# Patient Record
Sex: Male | Born: 1935 | Race: White | Hispanic: No | Marital: Single
Health system: Southern US, Community
[De-identification: ages and names within clinical notes are randomized; demographics above are authoritative.]

---

## 2020-01-07 ENCOUNTER — Other Ambulatory Visit: Payer: Self-pay

## 2020-01-07 ENCOUNTER — Inpatient Hospital Stay (HOSPITAL_COMMUNITY)
Admission: EM | Admit: 2020-01-07 | Discharge: 2020-01-13 | DRG: 062 | Disposition: A | Discharge status: Skilled Nursing Facility | Payer: Medicare Other | Attending: Neurology | Admitting: Neurology

## 2020-01-07 ENCOUNTER — Emergency Department (HOSPITAL_COMMUNITY): Payer: Medicare Other

## 2020-01-07 ENCOUNTER — Encounter (HOSPITAL_COMMUNITY): Payer: Self-pay | Admitting: Radiology

## 2020-01-07 DIAGNOSIS — R32 Unspecified urinary incontinence: Secondary | ICD-10-CM | POA: Diagnosis present

## 2020-01-07 DIAGNOSIS — Z8546 Personal history of malignant neoplasm of prostate: Secondary | ICD-10-CM

## 2020-01-07 DIAGNOSIS — Z20822 Contact with and (suspected) exposure to covid-19: Secondary | ICD-10-CM | POA: Diagnosis present

## 2020-01-07 DIAGNOSIS — Z66 Do not resuscitate: Secondary | ICD-10-CM | POA: Diagnosis present

## 2020-01-07 DIAGNOSIS — R414 Neurologic neglect syndrome: Secondary | ICD-10-CM | POA: Diagnosis present

## 2020-01-07 DIAGNOSIS — R4781 Slurred speech: Secondary | ICD-10-CM | POA: Diagnosis present

## 2020-01-07 DIAGNOSIS — I6521 Occlusion and stenosis of right carotid artery: Secondary | ICD-10-CM | POA: Diagnosis present

## 2020-01-07 DIAGNOSIS — E079 Disorder of thyroid, unspecified: Secondary | ICD-10-CM | POA: Diagnosis present

## 2020-01-07 DIAGNOSIS — E876 Hypokalemia: Secondary | ICD-10-CM | POA: Diagnosis present

## 2020-01-07 DIAGNOSIS — Y900 Blood alcohol level of less than 20 mg/100 ml: Secondary | ICD-10-CM | POA: Diagnosis present

## 2020-01-07 DIAGNOSIS — I63511 Cerebral infarction due to unspecified occlusion or stenosis of right middle cerebral artery: Secondary | ICD-10-CM | POA: Diagnosis not present

## 2020-01-07 DIAGNOSIS — E1122 Type 2 diabetes mellitus with diabetic chronic kidney disease: Secondary | ICD-10-CM | POA: Diagnosis present

## 2020-01-07 DIAGNOSIS — R05 Cough: Secondary | ICD-10-CM

## 2020-01-07 DIAGNOSIS — E1151 Type 2 diabetes mellitus with diabetic peripheral angiopathy without gangrene: Secondary | ICD-10-CM | POA: Diagnosis present

## 2020-01-07 DIAGNOSIS — R29715 NIHSS score 15: Secondary | ICD-10-CM | POA: Diagnosis present

## 2020-01-07 DIAGNOSIS — R29810 Facial weakness: Secondary | ICD-10-CM | POA: Diagnosis present

## 2020-01-07 DIAGNOSIS — Z6829 Body mass index (BMI) 29.0-29.9, adult: Secondary | ICD-10-CM

## 2020-01-07 DIAGNOSIS — I639 Cerebral infarction, unspecified: Secondary | ICD-10-CM | POA: Diagnosis not present

## 2020-01-07 DIAGNOSIS — F0390 Unspecified dementia without behavioral disturbance: Secondary | ICD-10-CM | POA: Diagnosis present

## 2020-01-07 DIAGNOSIS — R451 Restlessness and agitation: Secondary | ICD-10-CM | POA: Diagnosis not present

## 2020-01-07 DIAGNOSIS — I63529 Cerebral infarction due to unspecified occlusion or stenosis of unspecified anterior cerebral artery: Secondary | ICD-10-CM | POA: Diagnosis present

## 2020-01-07 DIAGNOSIS — F329 Major depressive disorder, single episode, unspecified: Secondary | ICD-10-CM | POA: Diagnosis present

## 2020-01-07 DIAGNOSIS — Z888 Allergy status to other drugs, medicaments and biological substances status: Secondary | ICD-10-CM

## 2020-01-07 DIAGNOSIS — G8194 Hemiplegia, unspecified affecting left nondominant side: Secondary | ICD-10-CM | POA: Diagnosis present

## 2020-01-07 DIAGNOSIS — I129 Hypertensive chronic kidney disease with stage 1 through stage 4 chronic kidney disease, or unspecified chronic kidney disease: Secondary | ICD-10-CM | POA: Diagnosis present

## 2020-01-07 DIAGNOSIS — E669 Obesity, unspecified: Secondary | ICD-10-CM | POA: Diagnosis present

## 2020-01-07 DIAGNOSIS — R059 Cough, unspecified: Secondary | ICD-10-CM

## 2020-01-07 DIAGNOSIS — N1831 Chronic kidney disease, stage 3a: Secondary | ICD-10-CM | POA: Diagnosis present

## 2020-01-07 DIAGNOSIS — K5900 Constipation, unspecified: Secondary | ICD-10-CM | POA: Diagnosis present

## 2020-01-07 DIAGNOSIS — Z79899 Other long term (current) drug therapy: Secondary | ICD-10-CM

## 2020-01-07 DIAGNOSIS — Z9079 Acquired absence of other genital organ(s): Secondary | ICD-10-CM

## 2020-01-07 DIAGNOSIS — E785 Hyperlipidemia, unspecified: Secondary | ICD-10-CM | POA: Diagnosis present

## 2020-01-07 DIAGNOSIS — R1310 Dysphagia, unspecified: Secondary | ICD-10-CM | POA: Diagnosis present

## 2020-01-07 LAB — CBC
HCT: 46.2 % (ref 39.0–52.0)
Hemoglobin: 14.8 g/dL (ref 13.0–17.0)
MCH: 28.4 pg (ref 26.0–34.0)
MCHC: 32 g/dL (ref 30.0–36.0)
MCV: 88.7 fL (ref 80.0–100.0)
Platelets: 201 10*3/uL (ref 150–400)
RBC: 5.21 MIL/uL (ref 4.22–5.81)
RDW: 14.2 % (ref 11.5–15.5)
WBC: 7.7 10*3/uL (ref 4.0–10.5)
nRBC: 0 % (ref 0.0–0.2)

## 2020-01-07 LAB — I-STAT CHEM 8, ED
BUN: 21 mg/dL (ref 8–23)
Calcium, Ion: 1.19 mmol/L (ref 1.15–1.40)
Chloride: 100 mmol/L (ref 98–111)
Creatinine, Ser: 1.2 mg/dL (ref 0.61–1.24)
Glucose, Bld: 185 mg/dL — ABNORMAL HIGH (ref 70–99)
HCT: 44 % (ref 39.0–52.0)
Hemoglobin: 15 g/dL (ref 13.0–17.0)
Potassium: 3.5 mmol/L (ref 3.5–5.1)
Sodium: 138 mmol/L (ref 135–145)
TCO2: 30 mmol/L (ref 22–32)

## 2020-01-07 LAB — DIFFERENTIAL
Abs Immature Granulocytes: 0.03 10*3/uL (ref 0.00–0.07)
Basophils Absolute: 0 10*3/uL (ref 0.0–0.1)
Basophils Relative: 1 %
Eosinophils Absolute: 0.2 10*3/uL (ref 0.0–0.5)
Eosinophils Relative: 3 %
Immature Granulocytes: 0 %
Lymphocytes Relative: 23 %
Lymphs Abs: 1.8 10*3/uL (ref 0.7–4.0)
Monocytes Absolute: 0.6 10*3/uL (ref 0.1–1.0)
Monocytes Relative: 7 %
Neutro Abs: 5.1 10*3/uL (ref 1.7–7.7)
Neutrophils Relative %: 66 %

## 2020-01-07 LAB — CBG MONITORING, ED: Glucose-Capillary: 180 mg/dL — ABNORMAL HIGH (ref 70–99)

## 2020-01-07 MED ORDER — ALTEPLASE (STROKE) FULL DOSE INFUSION
90.0000 mg | Freq: Once | INTRAVENOUS | Status: AC
  Administered 2020-01-07: 90 mg via INTRAVENOUS
  Filled 2020-01-07: qty 100

## 2020-01-07 MED ORDER — IOHEXOL 350 MG/ML SOLN
75.0000 mL | Freq: Once | INTRAVENOUS | Status: AC | PRN
  Administered 2020-01-07: 75 mL via INTRAVENOUS

## 2020-01-07 MED ORDER — SODIUM CHLORIDE 0.9 % IV SOLN
50.0000 mL | Freq: Once | INTRAVENOUS | Status: AC
  Administered 2020-01-08: 50 mL via INTRAVENOUS

## 2020-01-07 NOTE — ED Triage Notes (Signed)
Patient arrived with EMS from [facility] , staff reported LSN 2200 , at 2245 noticed right side gaze with slurred speech , right facial droop and left arm weakness , evaluated by EDP and neurologist at arrival , transported to CT scan with RN and neurologist .

## 2020-01-07 NOTE — Progress Notes (Signed)
Pharmacist Code Stroke Response  Notified to mix tPA at 2345 by Dr. Otelia Limes Delivered tPA to RN at 2348  tPA dose = 9mg  bolus over 1 minute followed by 81mg  for a total dose of 90mg  over 1 hour   01/07/20 11:51 PM

## 2020-01-07 NOTE — ED Notes (Signed)
TPA  IV infusion started at 2350 , respirations unlabored , iv sites intact .

## 2020-01-08 ENCOUNTER — Emergency Department (HOSPITAL_COMMUNITY): Payer: Medicare Other

## 2020-01-08 ENCOUNTER — Inpatient Hospital Stay (HOSPITAL_COMMUNITY): Payer: Medicare Other

## 2020-01-08 DIAGNOSIS — N1831 Chronic kidney disease, stage 3a: Secondary | ICD-10-CM | POA: Diagnosis present

## 2020-01-08 DIAGNOSIS — Z66 Do not resuscitate: Secondary | ICD-10-CM | POA: Diagnosis present

## 2020-01-08 DIAGNOSIS — I63529 Cerebral infarction due to unspecified occlusion or stenosis of unspecified anterior cerebral artery: Secondary | ICD-10-CM | POA: Diagnosis present

## 2020-01-08 DIAGNOSIS — I639 Cerebral infarction, unspecified: Secondary | ICD-10-CM | POA: Diagnosis present

## 2020-01-08 DIAGNOSIS — Z888 Allergy status to other drugs, medicaments and biological substances status: Secondary | ICD-10-CM | POA: Diagnosis not present

## 2020-01-08 DIAGNOSIS — E1151 Type 2 diabetes mellitus with diabetic peripheral angiopathy without gangrene: Secondary | ICD-10-CM | POA: Diagnosis present

## 2020-01-08 DIAGNOSIS — R32 Unspecified urinary incontinence: Secondary | ICD-10-CM | POA: Diagnosis present

## 2020-01-08 DIAGNOSIS — I6521 Occlusion and stenosis of right carotid artery: Secondary | ICD-10-CM | POA: Diagnosis present

## 2020-01-08 DIAGNOSIS — Y900 Blood alcohol level of less than 20 mg/100 ml: Secondary | ICD-10-CM | POA: Diagnosis present

## 2020-01-08 DIAGNOSIS — K5900 Constipation, unspecified: Secondary | ICD-10-CM | POA: Diagnosis present

## 2020-01-08 DIAGNOSIS — I6389 Other cerebral infarction: Secondary | ICD-10-CM

## 2020-01-08 DIAGNOSIS — Z79899 Other long term (current) drug therapy: Secondary | ICD-10-CM | POA: Diagnosis not present

## 2020-01-08 DIAGNOSIS — E1122 Type 2 diabetes mellitus with diabetic chronic kidney disease: Secondary | ICD-10-CM | POA: Diagnosis present

## 2020-01-08 DIAGNOSIS — I10 Essential (primary) hypertension: Secondary | ICD-10-CM | POA: Diagnosis not present

## 2020-01-08 DIAGNOSIS — I129 Hypertensive chronic kidney disease with stage 1 through stage 4 chronic kidney disease, or unspecified chronic kidney disease: Secondary | ICD-10-CM | POA: Diagnosis present

## 2020-01-08 DIAGNOSIS — R1310 Dysphagia, unspecified: Secondary | ICD-10-CM | POA: Diagnosis present

## 2020-01-08 DIAGNOSIS — R29810 Facial weakness: Secondary | ICD-10-CM | POA: Diagnosis present

## 2020-01-08 DIAGNOSIS — G8194 Hemiplegia, unspecified affecting left nondominant side: Secondary | ICD-10-CM | POA: Diagnosis present

## 2020-01-08 DIAGNOSIS — F0390 Unspecified dementia without behavioral disturbance: Secondary | ICD-10-CM | POA: Diagnosis present

## 2020-01-08 DIAGNOSIS — R414 Neurologic neglect syndrome: Secondary | ICD-10-CM | POA: Diagnosis present

## 2020-01-08 DIAGNOSIS — E876 Hypokalemia: Secondary | ICD-10-CM | POA: Diagnosis present

## 2020-01-08 DIAGNOSIS — R29715 NIHSS score 15: Secondary | ICD-10-CM | POA: Diagnosis present

## 2020-01-08 DIAGNOSIS — Z20822 Contact with and (suspected) exposure to covid-19: Secondary | ICD-10-CM | POA: Diagnosis present

## 2020-01-08 DIAGNOSIS — E1159 Type 2 diabetes mellitus with other circulatory complications: Secondary | ICD-10-CM | POA: Diagnosis not present

## 2020-01-08 DIAGNOSIS — E785 Hyperlipidemia, unspecified: Secondary | ICD-10-CM | POA: Diagnosis present

## 2020-01-08 DIAGNOSIS — R4781 Slurred speech: Secondary | ICD-10-CM | POA: Diagnosis present

## 2020-01-08 DIAGNOSIS — Z8546 Personal history of malignant neoplasm of prostate: Secondary | ICD-10-CM | POA: Diagnosis not present

## 2020-01-08 DIAGNOSIS — I6340 Cerebral infarction due to embolism of unspecified cerebral artery: Secondary | ICD-10-CM | POA: Diagnosis not present

## 2020-01-08 DIAGNOSIS — I63511 Cerebral infarction due to unspecified occlusion or stenosis of right middle cerebral artery: Secondary | ICD-10-CM | POA: Diagnosis present

## 2020-01-08 DIAGNOSIS — Z9079 Acquired absence of other genital organ(s): Secondary | ICD-10-CM | POA: Diagnosis not present

## 2020-01-08 LAB — GLUCOSE, CAPILLARY
Glucose-Capillary: 120 mg/dL — ABNORMAL HIGH (ref 70–99)
Glucose-Capillary: 133 mg/dL — ABNORMAL HIGH (ref 70–99)
Glucose-Capillary: 167 mg/dL — ABNORMAL HIGH (ref 70–99)

## 2020-01-08 LAB — LIPID PANEL
Cholesterol: 190 mg/dL (ref 0–200)
HDL: 60 mg/dL (ref >40)
LDL Cholesterol: 108 mg/dL — ABNORMAL HIGH (ref 0–99)
Total CHOL/HDL Ratio: 3.2 RATIO
Triglycerides: 111 mg/dL (ref <150)
VLDL: 22 mg/dL (ref 0–40)

## 2020-01-08 LAB — ECHOCARDIOGRAM COMPLETE
Height: 72 in
Weight: 3432.12 oz

## 2020-01-08 LAB — COMPREHENSIVE METABOLIC PANEL
ALT: 21 U/L (ref 0–44)
AST: 18 U/L (ref 15–41)
Albumin: 3.3 g/dL — ABNORMAL LOW (ref 3.5–5.0)
Alkaline Phosphatase: 66 U/L (ref 38–126)
Anion gap: 8 (ref 5–15)
BUN: 19 mg/dL (ref 8–23)
CO2: 27 mmol/L (ref 22–32)
Calcium: 9 mg/dL (ref 8.9–10.3)
Chloride: 103 mmol/L (ref 98–111)
Creatinine, Ser: 1.17 mg/dL (ref 0.61–1.24)
GFR calc Af Amer: >60 mL/min (ref >60)
GFR calc non Af Amer: 57 mL/min — ABNORMAL LOW (ref >60)
Glucose, Bld: 188 mg/dL — ABNORMAL HIGH (ref 70–99)
Potassium: 3.8 mmol/L (ref 3.5–5.1)
Sodium: 138 mmol/L (ref 135–145)
Total Bilirubin: 0.7 mg/dL (ref 0.3–1.2)
Total Protein: 6.3 g/dL — ABNORMAL LOW (ref 6.5–8.1)

## 2020-01-08 LAB — PROTIME-INR
INR: 1.1 (ref 0.8–1.2)
Prothrombin Time: 13.7 seconds (ref 11.4–15.2)

## 2020-01-08 LAB — MRSA PCR SCREENING: MRSA by PCR: NEGATIVE

## 2020-01-08 LAB — RESPIRATORY PANEL BY RT PCR (FLU A&B, COVID)
Influenza A by PCR: NEGATIVE
Influenza B by PCR: NEGATIVE
SARS Coronavirus 2 by RT PCR: NEGATIVE

## 2020-01-08 LAB — APTT: aPTT: 30 seconds (ref 24–36)

## 2020-01-08 LAB — ETHANOL: Alcohol, Ethyl (B): <10 mg/dL (ref <10)

## 2020-01-08 MED ORDER — CHLORHEXIDINE GLUCONATE 0.12 % MT SOLN
15.0000 mL | Freq: Two times a day (BID) | OROMUCOSAL | Status: DC
  Administered 2020-01-08 – 2020-01-13 (×12): 15 mL via OROMUCOSAL
  Filled 2020-01-08 (×10): qty 15

## 2020-01-08 MED ORDER — PRAVASTATIN SODIUM 40 MG PO TABS
40.0000 mg | ORAL_TABLET | Freq: Every day | ORAL | Status: DC
  Administered 2020-01-08 – 2020-01-12 (×5): 40 mg via ORAL
  Filled 2020-01-08 (×6): qty 1

## 2020-01-08 MED ORDER — ACETAMINOPHEN 325 MG PO TABS
650.0000 mg | ORAL_TABLET | ORAL | Status: DC | PRN
  Administered 2020-01-11: 650 mg via ORAL
  Filled 2020-01-08: qty 2

## 2020-01-08 MED ORDER — LISINOPRIL 20 MG PO TABS
20.0000 mg | ORAL_TABLET | Freq: Every day | ORAL | Status: DC
  Administered 2020-01-08 – 2020-01-13 (×6): 20 mg via ORAL
  Filled 2020-01-08 (×7): qty 1

## 2020-01-08 MED ORDER — ACETAMINOPHEN 650 MG RE SUPP: 650.0000 mg | RECTAL | Status: DC | PRN

## 2020-01-08 MED ORDER — PANTOPRAZOLE SODIUM 40 MG IV SOLR
40.0000 mg | Freq: Every day | INTRAVENOUS | Status: DC
  Administered 2020-01-08 – 2020-01-11 (×5): 40 mg via INTRAVENOUS
  Filled 2020-01-08 (×5): qty 40

## 2020-01-08 MED ORDER — HYDRALAZINE HCL 20 MG/ML IJ SOLN
INTRAMUSCULAR | Status: AC
  Filled 2020-01-08: qty 1

## 2020-01-08 MED ORDER — HYDRALAZINE HCL 20 MG/ML IJ SOLN
10.0000 mg | Freq: Once | INTRAMUSCULAR | Status: AC
  Administered 2020-01-08: 10 mg via INTRAVENOUS

## 2020-01-08 MED ORDER — CLEVIDIPINE BUTYRATE 0.5 MG/ML IV EMUL
0.0000 mg/h | INTRAVENOUS | Status: DC
  Administered 2020-01-08: 1 mg/h via INTRAVENOUS
  Filled 2020-01-08 (×2): qty 50

## 2020-01-08 MED ORDER — CHLORHEXIDINE GLUCONATE CLOTH 2 % EX PADS
6.0000 | MEDICATED_PAD | Freq: Every day | CUTANEOUS | Status: DC
  Administered 2020-01-08 – 2020-01-09 (×2): 6 via TOPICAL

## 2020-01-08 MED ORDER — POLYETHYLENE GLYCOL 3350 17 G PO PACK
17.0000 g | PACK | Freq: Every day | ORAL | Status: DC | PRN
  Administered 2020-01-13: 17 g via ORAL
  Filled 2020-01-08: qty 1

## 2020-01-08 MED ORDER — HYDRALAZINE HCL 20 MG/ML IJ SOLN
20.0000 mg | Freq: Four times a day (QID) | INTRAMUSCULAR | Status: DC | PRN
  Administered 2020-01-08 – 2020-01-09 (×2): 20 mg via INTRAVENOUS
  Filled 2020-01-08 (×2): qty 1

## 2020-01-08 MED ORDER — ACETAMINOPHEN 160 MG/5ML PO SOLN: 650.0000 mg | ORAL | Status: DC | PRN

## 2020-01-08 MED ORDER — STROKE: EARLY STAGES OF RECOVERY BOOK
Freq: Once | Status: AC
  Filled 2020-01-08: qty 1

## 2020-01-08 MED ORDER — DONEPEZIL HCL 10 MG PO TABS
10.0000 mg | ORAL_TABLET | Freq: Every day | ORAL | Status: DC
  Administered 2020-01-09 – 2020-01-12 (×5): 10 mg via ORAL
  Filled 2020-01-08 (×5): qty 1

## 2020-01-08 MED ORDER — VENLAFAXINE HCL ER 75 MG PO CP24
150.0000 mg | ORAL_CAPSULE | Freq: Every day | ORAL | Status: DC
  Administered 2020-01-08 – 2020-01-13 (×6): 150 mg via ORAL
  Filled 2020-01-08 (×2): qty 2
  Filled 2020-01-08 (×2): qty 1
  Filled 2020-01-08 (×4): qty 2
  Filled 2020-01-08: qty 1

## 2020-01-08 MED ORDER — SENNOSIDES-DOCUSATE SODIUM 8.6-50 MG PO TABS
1.0000 | ORAL_TABLET | Freq: Every evening | ORAL | Status: DC | PRN
  Administered 2020-01-13: 1 via ORAL
  Filled 2020-01-08: qty 1

## 2020-01-08 MED ORDER — HYDROCHLOROTHIAZIDE 12.5 MG PO CAPS
12.5000 mg | ORAL_CAPSULE | Freq: Every day | ORAL | Status: DC
  Administered 2020-01-08 – 2020-01-12 (×5): 12.5 mg via ORAL
  Filled 2020-01-08 (×6): qty 1

## 2020-01-08 MED ORDER — PRAVASTATIN SODIUM 10 MG PO TABS: 20.0000 mg | ORAL_TABLET | Freq: Every day | ORAL | Status: DC

## 2020-01-08 MED ORDER — BUSPIRONE HCL 10 MG PO TABS
10.0000 mg | ORAL_TABLET | Freq: Two times a day (BID) | ORAL | Status: DC
  Administered 2020-01-09 – 2020-01-13 (×10): 10 mg via ORAL
  Filled 2020-01-08 (×11): qty 1

## 2020-01-08 MED ORDER — ORAL CARE MOUTH RINSE
15.0000 mL | Freq: Two times a day (BID) | OROMUCOSAL | Status: DC
  Administered 2020-01-08 – 2020-01-13 (×10): 15 mL via OROMUCOSAL

## 2020-01-08 MED ORDER — SODIUM CHLORIDE 0.9 % IV SOLN: INTRAVENOUS | Status: AC

## 2020-01-08 MED ORDER — ASPIRIN 81 MG PO CHEW
CHEWABLE_TABLET | ORAL | Status: AC
  Filled 2020-01-08: qty 1

## 2020-01-08 MED ORDER — ASPIRIN 81 MG PO CHEW
81.0000 mg | CHEWABLE_TABLET | Freq: Every day | ORAL | Status: DC
  Administered 2020-01-08 – 2020-01-12 (×5): 81 mg via ORAL
  Filled 2020-01-08 (×5): qty 1

## 2020-01-08 MED ORDER — INSULIN ASPART 100 UNIT/ML ~~LOC~~ SOLN
0.0000 [IU] | SUBCUTANEOUS | Status: DC
  Administered 2020-01-08: 3 [IU] via SUBCUTANEOUS
  Administered 2020-01-09 – 2020-01-11 (×9): 2 [IU] via SUBCUTANEOUS
  Administered 2020-01-11: 3 [IU] via SUBCUTANEOUS
  Administered 2020-01-12 (×3): 2 [IU] via SUBCUTANEOUS
  Administered 2020-01-13: 3 [IU] via SUBCUTANEOUS
  Administered 2020-01-13: 2 [IU] via SUBCUTANEOUS

## 2020-01-08 MED ORDER — MIRABEGRON ER 25 MG PO TB24
50.0000 mg | ORAL_TABLET | Freq: Every day | ORAL | Status: DC
  Filled 2020-01-08: qty 2
  Filled 2020-01-08: qty 1
  Filled 2020-01-08: qty 2
  Filled 2020-01-08: qty 1

## 2020-01-08 NOTE — Progress Notes (Signed)
Modified Barium Swallow Progress Note  Patient Details  Name: Jeremiah Mejia MRN: [id number] Date of Birth: 30-Jun-1933  Today's Date: 01/08/2020  Modified Barium Swallow completed.  Full report located under Chart Review in the Imaging Section.  Brief recommendations include the following:  Clinical Impression  Patient presents with moderate oropharyngeal dysphagia. Oral phase is remarkable for prolonged mastication and lingual residue. Pharyngeal phase is remarkable for reduced tongue base retraction and epiglottic inversion, leading to reduced laryngeal closure and a moderate amount of vallecular and pyriform sinus residue. The reduced laryngeal clousure and pharyngeal residue resulted in aspiration during and after the swallow with thin (PAS 7) and nectar thick (PAS 6 x1) liquids. The pt attempted to swallow mutliple times to reduce the residue, however it wasn't fully cleared. The pt's current mentation is fluctuating and required cues to maintain adequate alert state by the end of the study. The pt is not currently ready for a full diet, but may have snacks of purees and NTL throughout the day, as long as he is alert and following general aspiration precations. His meds may also be crushed in purees. Will continue to follow to determine readiness for a diet as mentation hopefully clears, and as is clinically appropriate.   Swallow Evaluation Recommendations       SLP Diet Recommendations: Other (Comment)(may have snacks of puree and nectar thick liquids)   Liquid Administration via: Straw   Medication Administration: Crushed with puree   Supervision: Full supervision/cueing for compensatory strategies   Compensations: Minimize environmental distractions;Slow rate;Small sips/bites   Postural Changes: Seated upright at 90 degrees   Oral Care Recommendations: Oral care before and after PO   Other Recommendations: Prohibited food (jello, ice cream, thin soups)    Maudry Mayhew, Student  SLP Office: [phone]  01/08/2020,2:37 PM

## 2020-01-08 NOTE — ED Provider Notes (Signed)
[facility] EMERGENCY DEPARTMENT Provider Note   CSN: [id number] Arrival date & time: 01/07/20  2329  An emergency department physician performed an initial assessment on this suspected stroke patient at 2336.  History Chief Complaint  Patient presents with  . Code Stroke    Jeremiah Mejia is a 86 y.o. male.  Patient brought to the emergency department by ambulance as a code stroke.  He comes to the emergency department by ambulance from skilled nursing facility.  Patient was last seen normal at 10 PM.  45 minutes later he was noted with slurred speech and persistent right gaze with left-sided weakness.        History reviewed. No pertinent past medical history.  There are no problems to display for this patient.   History reviewed. No pertinent surgical history.     No family history on file.  Social History   Tobacco Use  . Smoking status: Unknown If Ever Smoked  Substance Use Topics  . Alcohol use: Not on file  . Drug use: Not on file    Home Medications Prior to Admission medications   Medication Sig Start Date End Date Taking? Authorizing Provider  busPIRone (BUSPAR) 10 MG tablet Take 10 mg by mouth 2 (two) times daily.   Yes [provider]  donepezil (ARICEPT) 10 MG tablet Take 10 mg by mouth at bedtime.   Yes [provider]  lisinopril-hydrochlorothiazide (ZESTORETIC) 20-12.5 MG tablet Take 1 tablet by mouth daily.   Yes [provider]  mirabegron ER (MYRBETRIQ) 50 MG TB24 tablet Take 50 mg by mouth daily.   Yes [provider]  polyethylene glycol (MIRALAX / GLYCOLAX) 17 g packet Take 17 g by mouth daily as needed for mild constipation or moderate constipation.   Yes [provider]  pravastatin (PRAVACHOL) 20 MG tablet Take 20 mg by mouth daily.   Yes [provider]  venlafaxine XR (EFFEXOR-XR) 150 MG 24 hr capsule Take 150 mg by mouth daily with breakfast.   Yes [provider]    Allergies    Pseudoephedrine hcl  Review of Systems   Review of Systems  Neurological: Positive for weakness.  All other systems reviewed and are negative.   Physical Exam Updated Vital Signs BP (!) 132/56   Pulse 65   Temp (!) 97.4 F (36.3 C)   Resp 18   Ht 5\' 10"  (1.778 m)   Wt 101.3 kg   SpO2 96%   BMI 32.04 kg/m   Physical Exam Vitals and nursing note reviewed.  Constitutional:      General: He is not in acute distress.    Appearance: Normal appearance. He is well-developed.  HENT:     Head: Normocephalic and atraumatic.     Right Ear: Hearing normal.     Left Ear: Hearing normal.     Nose: Nose normal.  Eyes:     Conjunctiva/sclera: Conjunctivae normal.     Pupils: Pupils are equal, round, and reactive to light.  Cardiovascular:     Rate and Rhythm: Regular rhythm.     Heart sounds: S1 normal and S2 normal. No murmur. No friction rub. No gallop.   Pulmonary:     Effort: Pulmonary effort is normal. No respiratory distress.     Breath sounds: Normal breath sounds.  Chest:     Chest wall: No tenderness.  Abdominal:     General: Bowel sounds are normal.     Palpations: Abdomen is soft.  Tenderness: There is no abdominal tenderness. There is no guarding or rebound. Negative signs include Murphy's sign and McBurney's sign.     Hernia: No hernia is present.  Musculoskeletal:        General: Normal range of motion.     Cervical back: Normal range of motion and neck supple.  Skin:    General: Skin is warm and dry.     Findings: No rash.  Neurological:     Mental Status: He is alert.     GCS: GCS eye subscore is 4. GCS verbal subscore is 5. GCS motor subscore is 6.     Cranial Nerves: No cranial nerve deficit.     Sensory: No sensory deficit.     Coordination: Coordination normal.     Comments: Disoriented to place and time  Persistent right gaze  Left-sided weakness  Psychiatric:        Speech: Speech normal.     ED Results /  Procedures / Treatments   Labs (all labs ordered are listed, but only abnormal results are displayed) Labs Reviewed  COMPREHENSIVE METABOLIC PANEL - Abnormal; Notable for the following components:      Result Value   Glucose, Bld 188 (*)    Total Protein 6.3 (*)    Albumin 3.3 (*)    GFR calc non Af Amer 57 (*)    All other components within normal limits  I-STAT CHEM 8, ED - Abnormal; Notable for the following components:   Glucose, Bld 185 (*)    All other components within normal limits  CBG MONITORING, ED - Abnormal; Notable for the following components:   Glucose-Capillary 180 (*)    All other components within normal limits  RESPIRATORY PANEL BY RT PCR (FLU A&B, COVID)  ETHANOL  PROTIME-INR  APTT  CBC  DIFFERENTIAL  RAPID URINE DRUG SCREEN, HOSP PERFORMED  URINALYSIS, ROUTINE W REFLEX MICROSCOPIC    EKG EKG Interpretation  Date/Time:  Tuesday January 08 2020 00:13:51 EDT Ventricular Rate:  70 PR Interval:    QRS Duration: 87 QT Interval:  568 QTC Calculation: 534 R Axis:   -15 Text Interpretation: SR PACs Borderline left axis deviation Nonspecific T abnormalities, lateral leads Prolonged QT interval Confirmed by Orpah Greek [phone]) on 01/08/2020 12:17:23 AM   Radiology CT HEAD CODE STROKE WO CONTRAST  Result Date: 01/07/2020 CLINICAL DATA:  Code stroke. Left-sided weakness with right-sided gaze. EXAM: CT HEAD WITHOUT CONTRAST TECHNIQUE: Contiguous axial images were obtained from the base of the skull through the vertex without intravenous contrast. COMPARISON:  None. FINDINGS: Brain: There is no mass, hemorrhage or extra-axial collection. The size and configuration of the ventricles and extra-axial CSF spaces are normal. There is hypoattenuation of the periventricular white matter, most commonly indicating chronic ischemic microangiopathy. Vascular: No abnormal hyperdensity of the major intracranial arteries or dural venous sinuses. Mild intracranial  atherosclerosis. Skull: The visualized skull base, calvarium and extracranial soft tissues are normal. Sinuses/Orbits: No fluid levels or advanced mucosal thickening of the visualized paranasal sinuses. No mastoid or middle ear effusion. The orbits are normal. ASPECTS [facility] Stroke Program Early CT Score) - Ganglionic level infarction (caudate, lentiform nuclei, internal capsule, insula, M1-M3 cortex): 7 - Supraganglionic infarction (M4-M6 cortex): 3 Total score (0-10 with 10 being normal): 10 IMPRESSION: 1. Chronic small vessel disease without acute intracranial abnormality. 2. ASPECTS is 10. These results were communicated to Dr. Kerney Elbe at 11:48 pm on 01/07/2020 by text page via the [facility] messaging system. * Electronically  Signed   By: Deatra Robinson M.D.   On: 01/07/2020 23:48    Procedures Procedures (including critical care time)  Medications Ordered in ED Medications  venlafaxine XR (EFFEXOR-XR) 24 hr capsule 150 mg (has no administration in time range)  donepezil (ARICEPT) tablet 10 mg (has no administration in time range)  lisinopril (ZESTRIL) tablet 20 mg (has no administration in time range)  hydrochlorothiazide (MICROZIDE) capsule 12.5 mg (has no administration in time range)  polyethylene glycol (MIRALAX / GLYCOLAX) packet 17 g (has no administration in time range)  mirabegron ER (MYRBETRIQ) tablet 50 mg (has no administration in time range)  pravastatin (PRAVACHOL) tablet 20 mg (has no administration in time range)  busPIRone (BUSPAR) tablet 10 mg (has no administration in time range)  alteplase (ACTIVASE) 1 mg/mL infusion 90 mg (0 mg Intravenous Stopped 01/08/20 0039)    Followed by  0.9 %  sodium chloride infusion (50 mLs Intravenous New Bag/Given 01/08/20 0041)  iohexol (OMNIPAQUE) 350 MG/ML injection 75 mL (75 mLs Intravenous Contrast Given 01/07/20 2348)  hydrALAZINE (APRESOLINE) injection 10 mg (10 mg Intravenous Given 01/08/20 0014)    ED Course  I have reviewed the  triage vital signs and the nursing notes.  Pertinent labs & imaging results that were available during my care of the patient were reviewed by me and considered in my medical decision making (see chart for details).    MDM Rules/Calculators/A&P                     Patient arrives as a code stroke.  He was examined at arrival and did not have any airway compromise.  He was brought to radiology by neurology for work-up and determined to be a candidate for IV TPA.  Will be admitted by neurology.  Final Clinical Impression(s) / ED Diagnoses Final diagnoses:  Stroke (cerebrum) [facility])    Rx / DC Orders ED Discharge Orders    None       Dannika Hilgeman, Canary Brim, MD 01/08/20 [phone]

## 2020-01-08 NOTE — Progress Notes (Signed)
STROKE TEAM PROGRESS NOTE   INTERVAL HISTORY I have personally reviewed history of presenting illness with the patient, electronic medical records and imaging films in PACS.  He presented with sudden onset of left hemiplegia and received IV TPA and seems to have shown some improvement.  Blood pressures are adequately controlled.  He is able to move the left side of the bed but still has left hemiparesis and mild facial weakness.  Post TPA brain imaging is pending  Vitals:   01/08/20 0850 01/08/20 0855 01/08/20 0900 01/08/20 0905  BP: 132/62 126/60 130/65 (!) 129/59  Pulse: 66 62 62 62  Resp: 19 18 19  (!) 21  Temp:      TempSrc:      SpO2:  92% 90% 92%  Weight:      Height:        CBC:  Recent Labs  Lab 01/07/20 2336 01/07/20 2348  WBC 7.7  --   NEUTROABS 5.1  --   HGB 14.8 15.0  HCT 46.2 44.0  MCV 88.7  --   PLT 201  --     Basic Metabolic Panel:  Recent Labs  Lab 01/07/20 2336 01/07/20 2348  NA 138 138  K 3.8 3.5  CL 103 100  CO2 27  --   GLUCOSE 188* 185*  BUN 19 21  CREATININE 1.17 1.20  CALCIUM 9.0  --    Lipid Panel:     Component Value Date/Time   CHOL 190 01/08/2020 0346   TRIG 111 01/08/2020 0346   HDL 60 01/08/2020 0346   CHOLHDL 3.2 01/08/2020 0346   VLDL 22 01/08/2020 0346   LDLCALC 108 (H) 01/08/2020 0346   HgbA1c: No results found for: HGBA1C Urine Drug Screen: No results found for: LABOPIA, COCAINSCRNUR, LABBENZ, AMPHETMU, THCU, LABBARB  Alcohol Level     Component Value Date/Time   ETH <10 01/07/2020 2336    IMAGING past 24 hours DG CHEST PORT 1 VIEW  Result Date: 01/08/2020 CLINICAL DATA:  Stroke EXAM: PORTABLE CHEST 1 VIEW COMPARISON:  None. FINDINGS: Normal cardiomediastinal contours. Area of linear atelectasis near the right lung base. Limited visualization of the left lower lobe. Left costophrenic angle excluded from view. No left upper lobe consolidation. IMPRESSION: Incompletely visualized left lung base. Otherwise, no acute  airspace disease. Electronically Signed   By: Ulyses Jarred M.D.   On: 01/08/2020 01:13   CT HEAD CODE STROKE WO CONTRAST  Result Date: 01/07/2020 CLINICAL DATA:  Code stroke. Left-sided weakness with right-sided gaze. EXAM: CT HEAD WITHOUT CONTRAST TECHNIQUE: Contiguous axial images were obtained from the base of the skull through the vertex without intravenous contrast. COMPARISON:  None. FINDINGS: Brain: There is no mass, hemorrhage or extra-axial collection. The size and configuration of the ventricles and extra-axial CSF spaces are normal. There is hypoattenuation of the periventricular white matter, most commonly indicating chronic ischemic microangiopathy. Vascular: No abnormal hyperdensity of the major intracranial arteries or dural venous sinuses. Mild intracranial atherosclerosis. Skull: The visualized skull base, calvarium and extracranial soft tissues are normal. Sinuses/Orbits: No fluid levels or advanced mucosal thickening of the visualized paranasal sinuses. No mastoid or middle ear effusion. The orbits are normal. ASPECTS [facility] Stroke Program Early CT Score) - Ganglionic level infarction (caudate, lentiform nuclei, internal capsule, insula, M1-M3 cortex): 7 - Supraganglionic infarction (M4-M6 cortex): 3 Total score (0-10 with 10 being normal): 10 IMPRESSION: 1. Chronic small vessel disease without acute intracranial abnormality. 2. ASPECTS is 10. These results were communicated to Dr. Randall Hiss  Lindzen at 11:48 pm on 01/07/2020 by text page via the [facility] messaging system. * Electronically Signed   By: Deatra Robinson M.D.   On: 01/07/2020 23:48    PHYSICAL EXAM Pleasant elderly Caucasian male not in distress. . Afebrile. Head is nontraumatic. Neck is supple without bruit.    Cardiac exam no murmur or gallop. Lungs are clear to auscultation. Distal pulses are well felt. Neurological Exam : Patient is awake alert oriented to person only.  Diminished attention, registration and recall.  Is  confused.  Follows only simple midline commands.  No aphasia.  Right gaze preference but able to look to the left past midline.  Mild left-sided neglect.  Blinks to threat on the right but not so on the left.  Mild left facial weakness.  Tongue midline.  Motor system exam shows left hemiparesis grade 3/5 strength with mild left-sided drift.  Significant weakness of left grip and intrinsic hand muscles.  Normal strength on the right.  Mild diminished sensation on the left compared to the right with some left sensory inattention.  Tone is diminished on the left compared to the right.  Left plantar upgoing right downgoing.  Gait not tested. ASSESSMENT/PLAN [name] is a 86 y.o. male with history of prostate cancer s/p prostatectomy, DB, HTN, HLD, dementia presenting from [facility] with L sided weakness and slurred speech. Received IV tPA 4/12/20201 at 2350.   Right hemispheric infarct s/p IV TPA with some clinical improvement.  Suspect patient has dementia at baseline.  Code Stroke CT head No acute abnormality. Chronic Small vessel disease. ASPECTS 10.     CTA head & neck report pending  MRI pending  2D Echo EF 55-60%. No source of embolus   LDL 108  HgbA1c pending   SCDs for VTE prophylaxis  No antithrombotic prior to admission, now on No antithrombotic as within 24h of tPA administration. Hx hematuria requiring hospitalization. See Care Everywhere.   Therapy recommendations:  pending - ok to be OOB in am if remains stable  Disposition:  pending  (from [facility])  DNR - out of facility order accompanied pt  Hypertension  Home meds:  lisinopril 20 BP goal per post tPA protocol x 24h following tPA administration . Long-term BP goal normotensive  Hyperlipidemia  Home meds:  pravachol 20, resumed in hospital  LDL 108, goal < 70  Increase pravachol to 40  Continue statin at discharge  Diabetes type II   Found documentation of DB hx, but not on meds:    HgbA1c  pending, goal < 7.0  CBGs Recent Labs    01/07/20 2332  GLUCAP 180*      SSI  Dysphagia . Secondary to stroke . NPO . Speech on board  Other Stroke Risk Factors  Advanced age  Obesity, Body mass index is 29.09 kg/m., recommend weight loss, diet and exercise as appropriate   Other Active Problems  Baseline dementia on aricept  Depression on effexor   Constipation on miralax  Urinary incontinence of mirabegron   Hx prostate cancer 2002 s/p prostatectomy 2006. Admitted for hematuria 07/2019 which resolved w/ tx, foley.  Hospital day # 0 He presented with sudden onset of left hemiplegia and neglect likely due to right MCA infarct and was given IV TPA and has shown some improvement but still has significant left hemiplegia with neglect.  Continue close neurological monitoring and strict blood pressure control as per post TPA protocol.  Follow-up brain imaging later today.  Need to  discuss with family goals of care.  Continue ongoing stroke work-up.This patient is critically ill and at significant risk of neurological worsening, death and care requires constant monitoring of vital signs, hemodynamics,respiratory and cardiac monitoring, extensive review of multiple databases, frequent neurological assessment, discussion with family, other specialists and medical decision making of high complexity.I have made any additions or clarifications directly to the above note.This critical care time does not reflect procedure time, or teaching time or supervisory time of PA/NP/Med Resident etc but could involve care discussion time.  I spent 35 minutes of neurocritical care time  in the care of  this patient.    Delia Heady, MD  To contact Stroke Continuity provider, please refer to [email or web address]. After hours, contact General Neurology

## 2020-01-08 NOTE — H&P (Addendum)
Admission H&P    Chief Complaint: Acute onset of left sided weakness.   HPI: Jeremiah Mejia is an 86 y.o. male presenting from his [facility] via EMS as a Code Stroke after he was found by staff to be weak on his left side at 2245. LKN was 2200. On arrival, he has right sided gaze deviation, left sided weakness and garbled/slurred speech. No history of stroke per staff conversation with EMS.   CBG taken by EMS was 167. O2 Sat 97% on RA. BP 170/100. HR 60.   Per telephone call to his friend Leslye Peer, [phone]), who is his healthcare power of attorney, the patient is not taking a blood thinner, and has not had a recent operation, head trauma, MI or stroke.   LSN: [id number] tPA Given: Yes.  NIHSS: 15  PMHx Dementia Other PMHx unknown  History reviewed. No pertinent surgical history.  No family history on file. Social History:  has no history on file for tobacco, alcohol, and drug.  Allergies: No Known Allergies  (Not in a hospital admission)   ROS: Complains of a "weak head". Denies any additional symptoms, although of decreased reliability due to dementia and anosognosia regarding his left sided weakness.   Physical Examination: Blood pressure 138/65, pulse 64, resp. rate 16, height 5\' 10"  (1.778 m), weight 101.3 kg, SpO2 100 %.  HEENT-  [place]/AT  Lungs - Respirations unlabored Extremities - No cyanosis  Neurologic Examination: Ment: Awake and alert. Has left sided neglect and is not aware of his left sided weakness (anosognosia). Speech dysarthric but fluent. Little information content in his short replies to questions. Replies are often tangential. Able to follow basic motor commands. Oriented to self, city, state and the day of the week, but not the month or the year. Can name examiner's thumb but not the pinky finger.  CN: Left visual field cut. PERRL. Right sided gaze deviation cannot be overcome with oculocephalic maneuver. No nystagmus. FT intact bilaterally  but with extinction on the left. Left facial droop. Hearing intact to voice. Phonation intact. Shoulder shrug absent on the left. Tongue midline.  Motor:  0/5 LUE 0/5 LLE volitional movement but with triple flexion reflex to noxious plantar stimulation RUE 5/5 RLE 5/5 Sensory: Diminished FT sensation to LUE Absent FT sensation to LLE but pressure sensation present.  Extinction on the left to DSS.  Reflexes: 3+ bilateral brachioradialis and biceps 2+ left patella, 4+ right patella (crossed adductor response with low amplitude ipsilateral reflex) 0 achilles bilaterally Toes upgoing bilaterally.  Cerebellar: FNF without ataxia on the right. Unable to perform on the left Gait: Unable to assess.   Results for orders placed or performed during the hospital encounter of 01/07/20 (from the past 48 hour(s))  CBG monitoring, ED     Status: Abnormal   Collection Time: 01/07/20 11:32 PM  Result Value Ref Range   Glucose-Capillary 180 (H) 70 - 99 mg/dL    Comment: Glucose reference range applies only to samples taken after fasting for at least 8 hours.  CBC     Status: None   Collection Time: 01/07/20 11:36 PM  Result Value Ref Range   WBC 7.7 4.0 - 10.5 K/uL   RBC 5.21 4.22 - 5.81 MIL/uL   Hemoglobin 14.8 13.0 - 17.0 g/dL   HCT 46.2 39.0 - 52.0 %   MCV 88.7 80.0 - 100.0 fL   MCH 28.4 26.0 - 34.0 pg   MCHC 32.0 30.0 - 36.0 g/dL   RDW  14.2 11.5 - 15.5 %   Platelets 201 150 - 400 K/uL   nRBC 0.0 0.0 - 0.2 %    Comment: Performed at [facility], [address] [place], [place] [address]  Differential     Status: None   Collection Time: 01/07/20 11:36 PM  Result Value Ref Range   Neutrophils Relative % 66 %   Neutro Abs 5.1 1.7 - 7.7 K/uL   Lymphocytes Relative 23 %   Lymphs Abs 1.8 0.7 - 4.0 K/uL   Monocytes Relative 7 %   Monocytes Absolute 0.6 0.1 - 1.0 K/uL   Eosinophils Relative 3 %   Eosinophils Absolute 0.2 0.0 - 0.5 K/uL   Basophils Relative 1 %   Basophils Absolute 0.0  0.0 - 0.1 K/uL   Immature Granulocytes 0 %   Abs Immature Granulocytes 0.03 0.00 - 0.07 K/uL    Comment: Performed at [facility], [address] [place], [place] [address]  I-stat chem 8, ED     Status: Abnormal   Collection Time: 01/07/20 11:48 PM  Result Value Ref Range   Sodium 138 135 - 145 mmol/L   Potassium 3.5 3.5 - 5.1 mmol/L   Chloride 100 98 - 111 mmol/L   BUN 21 8 - 23 mg/dL   Creatinine, Ser 2.72 0.61 - 1.24 mg/dL   Glucose, Bld 536 (H) 70 - 99 mg/dL    Comment: Glucose reference range applies only to samples taken after fasting for at least 8 hours.   Calcium, Ion 1.19 1.15 - 1.40 mmol/L   TCO2 30 22 - 32 mmol/L   Hemoglobin 15.0 13.0 - 17.0 g/dL   HCT 64.4 03.4 - 74.2 %   CT HEAD CODE STROKE WO CONTRAST  Result Date: 01/07/2020 CLINICAL DATA:  Code stroke. Left-sided weakness with right-sided gaze. EXAM: CT HEAD WITHOUT CONTRAST TECHNIQUE: Contiguous axial images were obtained from the base of the skull through the vertex without intravenous contrast. COMPARISON:  None. FINDINGS: Brain: There is no mass, hemorrhage or extra-axial collection. The size and configuration of the ventricles and extra-axial CSF spaces are normal. There is hypoattenuation of the periventricular white matter, most commonly indicating chronic ischemic microangiopathy. Vascular: No abnormal hyperdensity of the major intracranial arteries or dural venous sinuses. Mild intracranial atherosclerosis. Skull: The visualized skull base, calvarium and extracranial soft tissues are normal. Sinuses/Orbits: No fluid levels or advanced mucosal thickening of the visualized paranasal sinuses. No mastoid or middle ear effusion. The orbits are normal. ASPECTS [facility] Stroke Program Early CT Score) - Ganglionic level infarction (caudate, lentiform nuclei, internal capsule, insula, M1-M3 cortex): 7 - Supraganglionic infarction (M4-M6 cortex): 3 Total score (0-10 with 10 being normal): 10 IMPRESSION: 1. Chronic small  vessel disease without acute intracranial abnormality. 2. ASPECTS is 10. These results were communicated to Dr. Caryl Pina at 11:48 pm on 01/07/2020 by text page via the [facility] messaging system. * Electronically Signed   By: Deatra Robinson M.D.   On: 01/07/2020 23:48    Assessment: 86 y.o. male presenting with acute onset of left sided weakness and hemineglect.  1. CT head shows no acute hemorrhage. ASPECTS is 10.  2. Exam and history are most consistent with a right MCA acute ischemic infarction.  3. CTA preliminarily reviewed. No proximal LVO is seen. A more distal vessel occlusion cannot be excluded. Await official Radiology report.  4. mRS at baseline is 3 based on interview with his HCPOA. He is not a candidate for thrombectomy.  5. Stroke Risk Factors -   Plan: 1. After comprehensive review of possible contraindications, she has no absolute contraindications to tPA administration. 2. The patient is a tPA candidate. Discussed extensively the risks/benefits of tPA treatment vs. no treatment with his healthcare power of attorney, Arline Asp Wear, including risks of hemorrhage and death with tPA administration versus worse overall outcomes on average in patients within tPA time window who are not administered tPA. The patient's dementia and anosognosia preclude meaningful medical decision making on his part at this time. Overall benefits of tPA regarding long-term prognosis are felt to outweigh risks. The patient's HCPOA expressed understanding and wish to proceed with tPA.  3. Admitting to Neuro ICU. Post-tPA order set to include frequent neuro checks and BP management. No antiplatelet medications or anticoagulants for at least 24 hours following tPA.  4. DVT prophylaxis with SCDs.  5. Fasting lipid panel, HgbA1c 6. Will need to start antiplatelet therapy if follow up CT at 24 hours is negative for hemorrhagic conversion. 7. Pharmacy has obtained his medications list and is reordering. Appreciate  Pharmacy assistance.  8. TTE.  9. MRI brain if no implantable devices. Stroke Team to contact HCPOA or assisted living facility in the AM to determine this. HCPOA no longer answering the phone for further interview. No answer from assisted living facility nursing station.  10. PT/OT/Speech.  11. NPO until passes swallow evaluation.  12. Telemetry monitoring 13. Full code  60 minutes spent in the emergent neurological evaluation and management of this critically ill patient.   Electronically signed: Dr. Caryl Pina 01/08/2020, 12:04 AM

## 2020-01-08 NOTE — Progress Notes (Signed)
OT Cancellation Note  Patient Details Name: Zachery Niswander MRN: [id number] DOB: 1933-08-17   Cancelled Treatment:    Reason Eval/Treat Not Completed: Active bedrest order. Strict bedrest order.  tPA given at 2350 on 01/07/20. Will return as schedule allows. Thank you.  Tessica Cupo M Torrence Branagan Archie Shea MSOT, OTR/L Acute Rehab Pager: [phone] Office: [phone] 01/08/2020, 7:21 AM

## 2020-01-08 NOTE — Progress Notes (Signed)
PT Cancellation Note  Patient Details Name: Jeremiah Mejia MRN: [id number] DOB: 09-02-1933   Cancelled Treatment:    Reason Eval/Treat Not Completed: Active bedrest order   tPA given at 2350 on 01/07/20. Will await increased activity orders and proceed with evaluation when medically appropriate. Thank you.   [name], PT Pager [phone]   [name] 01/08/2020, 8:23 AM

## 2020-01-08 NOTE — Progress Notes (Signed)
Yellow DNR form for patient accompanied him from his assisted living facility. Status changed to DNR.   Electronically signed: Dr. Caryl Pina

## 2020-01-08 NOTE — Evaluation (Signed)
Speech Language Pathology Evaluation Patient Details Name: Jeremiah Mejia MRN: [id number] DOB: 1933/08/11 Today's Date: 01/08/2020 Time: 2355-7322 SLP Time Calculation (min) (ACUTE ONLY): 25 min  Problem List:  Patient Active Problem List   Diagnosis Date Noted  . Stroke (cerebrum) (HCC) 01/08/2020   Past Medical History: History reviewed. No pertinent past medical history. Past Surgical History: History reviewed. No pertinent surgical history. HPI:  Jeremiah Mejia is an 86 y.o. male presenting from his Assisted Living Facility via EMS as a Code Stroke after he was found by staff to be weak on his left side. On arrival, he has right sided gaze deviation, left sided weakness and garbled/slurred speech. No history of stroke per staff conversation with EMS. Head CT (4/12) revealed chronic small vessel disease without acute intracranial abnormality. MRI to be completed.   Assessment / Plan / Recommendation Clinical Impression   Pt presents with the following cognitive linguistic impairments: orientation, attention and memory. He was also noted with dysarthria, and reduced intelligibility at the word level. Of note, the pt does have dementia at baseline, and currently lives in an assisted living facility. Pt noted with R gaze preference,  L inattention and overall L sided weakness.The pt is oriented to person and place, but disorientated to time and situation. He was able to recall 3/3 words during a short-term memory task, but difficulties with short-term memory during functional tasks throughout the session. He can follow simple one-step commands with Min cues, and receptive/expressive language appears intact. Patient will benefit from further SLP services acutely. Will continue to follow for specified deficits.     SLP Assessment  SLP Recommendation/Assessment: Patient needs continued Speech Lanaguage Pathology Services SLP Visit Diagnosis: Cognitive communication deficit (R41.841)    Follow Up  Recommendations  Skilled Nursing facility    Frequency and Duration min 2x/week  2 weeks      SLP Evaluation Cognition  Overall Cognitive Status: History of cognitive impairments - at baseline Arousal/Alertness: Awake/alert Orientation Level: Oriented to person;Oriented to place;Disoriented to time;Disoriented to situation Attention: Sustained Sustained Attention: Impaired Sustained Attention Impairment: Functional basic Memory: Impaired Memory Impairment: Storage deficit Awareness: Impaired Awareness Impairment: Intellectual impairment;Emergent impairment       Comprehension  Auditory Comprehension Overall Auditory Comprehension: Appears within functional limits for tasks assessed    Expression Expression Primary Mode of Expression: Verbal Verbal Expression Overall Verbal Expression: Appears within functional limits for tasks assessed   Oral / Motor  Oral Motor/Sensory Function Overall Oral Motor/Sensory Function: Mild impairment Facial ROM: Reduced left Facial Symmetry: Abnormal symmetry left Facial Strength: Reduced left Facial Sensation: Within Functional Limits Lingual ROM: Within Functional Limits Lingual Symmetry: Within Functional Limits Lingual Strength: Reduced Motor Speech Overall Motor Speech: Impaired Respiration: Within functional limits Phonation: Normal Resonance: Within functional limits Articulation: Impaired Level of Impairment: Word Intelligibility: Intelligibility reduced Word: 75-100% accurate Phrase: 75-100% accurate Sentence: 75-100% accurate Conversation: 75-100% accurate Motor Planning: Witnin functional limits   GO                   Maudry Mayhew, Student SLP Office: [phone]  01/08/2020, 7:59 AM

## 2020-01-08 NOTE — Plan of Care (Signed)
  Problem: Elimination: Goal: Will not experience complications related to urinary retention Outcome: Progressing   Problem: Pain Managment: Goal: General experience of comfort will improve Outcome: Progressing   Problem: Safety: Goal: Ability to remain free from injury will improve Outcome: Progressing   Problem: Education: Goal: Knowledge of disease or condition will improve Outcome: Progressing   Problem: Coping: Goal: Will verbalize positive feelings about self Outcome: Progressing Goal: Will identify appropriate support needs Outcome: Progressing   Problem: Nutrition: Goal: Adequate nutrition will be maintained Outcome: Not Progressing   Problem: Nutrition: Goal: Dietary intake will improve Outcome: Not Progressing

## 2020-01-08 NOTE — Evaluation (Signed)
Clinical/Bedside Swallow Evaluation Patient Details  Name: Jeremiah Mejia MRN: [id number] Date of Birth: [birth year]  Today's Date: 01/08/2020 Time: SLP Start Time (ACUTE ONLY): 0715 SLP Stop Time (ACUTE ONLY): 0740 SLP Time Calculation (min) (ACUTE ONLY): 25 min  Past Medical History: History reviewed. No pertinent past medical history. Past Surgical History: History reviewed. No pertinent surgical history. HPI:  Jeremiah Mejia is an 86 y.o. male presenting from his Assisted Living Facility via EMS as a Code Stroke after he was found by staff to be weak on his left side. On arrival, he has right sided gaze deviation, left sided weakness and garbled/slurred speech. No history of stroke per staff conversation with EMS. Head CT (4/12) revealed chronic small vessel disease without acute intracranial abnormality. MRI to be completed.   Assessment / Plan / Recommendation Clinical Impression   Pt upright in bed for session. Pt's oral mech revealed reduced L sided facial ROM, symmetry and weakness (CN VII). When providing oral care, dried blood was noted on pt's lips and inside of the pt's L cheek. Pt was offered ice chips, thin liquids, NTL and purees. Pt was observed with multiple swallows with all trials of POs. Immediately following cup sips of thin liquids, pt noted with coughing. Following NTL, pt noted with immediate and delayed throat clearing/coughing, increasing with progression of trials. Given consistent s/sx of aspiration and dysphagia throughout the session, recommend proceeding with an instrumental swallow study to further assess integrity of the pt's swallow function. This may be completed as early as this afternoon (4/13). Recommend pt remain NPO until study can be completed.  SLP Visit Diagnosis: Dysphagia, unspecified (R13.10)    Aspiration Risk  Mild aspiration risk    Diet Recommendation NPO   Medication Administration: Via alternative means    Other  Recommendations Oral Care  Recommendations: Oral care QID   Follow up Recommendations Skilled Nursing facility      Frequency and Duration min 2x/week  2 weeks       Prognosis Prognosis for Safe Diet Advancement: Fair Barriers to Reach Goals: Cognitive deficits      Swallow Study   General HPI: Jeremiah Mejia is an 86 y.o. male presenting from his Assisted Living Facility via EMS as a Code Stroke after he was found by staff to be weak on his left side. On arrival, he has right sided gaze deviation, left sided weakness and garbled/slurred speech. No history of stroke per staff conversation with EMS. Head CT (4/12) revealed chronic small vessel disease without acute intracranial abnormality. MRI to be completed. Type of Study: Bedside Swallow Evaluation Previous Swallow Assessment: none in chart Diet Prior to this Study: NPO Temperature Spikes Noted: No Respiratory Status: Room air History of Recent Intubation: No Behavior/Cognition: Alert;Cooperative Oral Cavity Assessment: Edema Oral Care Completed by SLP: Yes Oral Cavity - Dentition: Poor condition;Missing dentition Vision: Functional for self-feeding Self-Feeding Abilities: Able to feed self;Needs assist Patient Positioning: Upright in bed Baseline Vocal Quality: Normal Volitional Swallow: Able to elicit    Oral/Motor/Sensory Function Overall Oral Motor/Sensory Function: Mild impairment Facial ROM: Reduced left Facial Symmetry: Abnormal symmetry left Facial Strength: Reduced left Facial Sensation: Within Functional Limits Lingual ROM: Within Functional Limits Lingual Symmetry: Within Functional Limits Lingual Strength: Reduced   Ice Chips Ice chips: Impaired Pharyngeal Phase Impairments: Multiple swallows   Thin Liquid Thin Liquid: Impaired Presentation: Cup;Spoon;Straw Pharyngeal  Phase Impairments: Multiple swallows;Throat Clearing - Immediate;Cough - Delayed    Nectar Thick Nectar Thick Liquid: Impaired Presentation: Straw Pharyngeal Phase  Impairments: Multiple swallows;Throat Clearing - Immediate;Cough - Immediate;Cough - Delayed   Honey Thick Honey Thick Liquid: Not tested   Puree Puree: Impaired Pharyngeal Phase Impairments: Multiple swallows   Solid     Solid: Not tested     Aline August, Student SLP Office: [phone]  01/08/2020,8:09 AM

## 2020-01-08 NOTE — Social Work (Signed)
CSW was unable to participate in sbirt due to not being oriented.   Jeremiah Mejia, Theresia Majors, [place] Clinical Social Worker [phone]

## 2020-01-08 NOTE — Code Documentation (Signed)
Responded to Code Stroke called at 2301 for R gaze, L sided weakness, and slurred speech, [id number]. Pt arrived at 2328, CBG-180, NIH-15, CT head negative for acute changes. TPA started at 2350. CTA-no LVO. Plan to admit to ICU.

## 2020-01-08 NOTE — Progress Notes (Signed)
  Echocardiogram 2D Echocardiogram has been performed.  Tye Savoy 01/08/2020, 10:35 AM

## 2020-01-09 ENCOUNTER — Inpatient Hospital Stay (HOSPITAL_COMMUNITY): Payer: Medicare Other

## 2020-01-09 DIAGNOSIS — I639 Cerebral infarction, unspecified: Secondary | ICD-10-CM | POA: Diagnosis not present

## 2020-01-09 LAB — GLUCOSE, CAPILLARY
Glucose-Capillary: 106 mg/dL — ABNORMAL HIGH (ref 70–99)
Glucose-Capillary: 119 mg/dL — ABNORMAL HIGH (ref 70–99)
Glucose-Capillary: 123 mg/dL — ABNORMAL HIGH (ref 70–99)
Glucose-Capillary: 133 mg/dL — ABNORMAL HIGH (ref 70–99)
Glucose-Capillary: 143 mg/dL — ABNORMAL HIGH (ref 70–99)
Glucose-Capillary: 150 mg/dL — ABNORMAL HIGH (ref 70–99)

## 2020-01-09 LAB — HEMOGLOBIN A1C
Hgb A1c MFr Bld: 8.1 % — ABNORMAL HIGH (ref 4.8–5.6)
Mean Plasma Glucose: 186 mg/dL

## 2020-01-09 MED ORDER — RESOURCE THICKENUP CLEAR PO POWD
ORAL | Status: DC | PRN
  Filled 2020-01-09: qty 125

## 2020-01-09 MED ORDER — DONEPEZIL HCL 10 MG PO TABS: 10.0000 mg | ORAL_TABLET | Freq: Every day | ORAL | Status: DC

## 2020-01-09 MED ORDER — PRAVASTATIN SODIUM 10 MG PO TABS: 20.0000 mg | ORAL_TABLET | Freq: Every day | ORAL | Status: DC

## 2020-01-09 MED ORDER — VENLAFAXINE HCL ER 75 MG PO CP24: 150.0000 mg | ORAL_CAPSULE | Freq: Every day | ORAL | Status: DC

## 2020-01-09 MED ORDER — LISINOPRIL-HYDROCHLOROTHIAZIDE 20-12.5 MG PO TABS: 1.0000 | ORAL_TABLET | Freq: Every day | ORAL | Status: DC

## 2020-01-09 MED ORDER — MIRABEGRON ER 25 MG PO TB24: 50.0000 mg | ORAL_TABLET | Freq: Every day | ORAL | Status: DC

## 2020-01-09 MED ORDER — POLYETHYLENE GLYCOL 3350 17 G PO PACK: 17.0000 g | PACK | Freq: Every day | ORAL | Status: DC | PRN

## 2020-01-09 MED ORDER — BUSPIRONE HCL 10 MG PO TABS: 10.0000 mg | ORAL_TABLET | Freq: Two times a day (BID) | ORAL | Status: DC

## 2020-01-09 MED ORDER — ENOXAPARIN SODIUM 40 MG/0.4ML ~~LOC~~ SOLN
40.0000 mg | SUBCUTANEOUS | Status: DC
  Administered 2020-01-09 – 2020-01-13 (×5): 40 mg via SUBCUTANEOUS
  Filled 2020-01-09 (×5): qty 0.4

## 2020-01-09 MED ORDER — CLOPIDOGREL BISULFATE 75 MG PO TABS
75.0000 mg | ORAL_TABLET | Freq: Every day | ORAL | Status: DC
  Administered 2020-01-09 – 2020-01-13 (×5): 75 mg via ORAL
  Filled 2020-01-09 (×6): qty 1

## 2020-01-09 MED ORDER — LORAZEPAM 2 MG/ML IJ SOLN
2.0000 mg | Freq: Once | INTRAMUSCULAR | Status: AC
  Administered 2020-01-09: 2 mg via INTRAVENOUS
  Filled 2020-01-09: qty 1

## 2020-01-09 NOTE — Progress Notes (Signed)
  Speech Language Pathology Treatment: Dysphagia  Patient Details Name: Jeremiah Mejia MRN: [id number] DOB: 1933-04-21 Today's Date: 01/09/2020 Time: 0902-0920 SLP Time Calculation (min) (ACUTE ONLY): 18 min  Assessment / Plan / Recommendation Clinical Impression  Pt's mentation seems to be improving since the previous day. Pt was observed with NTL and purees. Of note, the pt had significant difficulties extracting sips via straw, which he did not struggle with yesterday (4/14). He continues to show s/sx of aspiration - immediate coughing/throat clearing following sips of NTL. The pt was noted to take large sips via cup, with s/sx of aspiration reducing given Mod cues for smaller bolus sips at a time. If he continues to have trouble, may consider administering liquids via spoon to regulate bolus size. The pt is still not ready for a diet, but recommend continuing with snacks of purees and NTL from the floor stock. Will continue to follow to determine readiness for a diet.   HPI HPI: Jeremiah Mejia is an 86 y.o. male presenting from his Assisted Living Facility via EMS as a Code Stroke after he was found by staff to be weak on his left side. On arrival, he has right sided gaze deviation, left sided weakness and garbled/slurred speech. No history of stroke per staff conversation with EMS. Head CT (4/12) revealed chronic small vessel disease without acute intracranial abnormality. MRI to be completed.      SLP Plan  Continue with current plan of care       Recommendations  Diet recommendations: Other(comment)(snacks of purees and nectar thick liquids) Liquids provided via: Straw;Cup;Teaspoon Medication Administration: Crushed with puree Supervision: Full supervision/cueing for compensatory strategies Compensations: Minimize environmental distractions;Slow rate;Small sips/bites Postural Changes and/or Swallow Maneuvers: Seated upright 90 degrees                Oral Care Recommendations: Oral  care BID Follow up Recommendations: Skilled Nursing facility SLP Visit Diagnosis: Dysphagia, oropharyngeal phase (R13.12) Plan: Continue with current plan of care       GO               Maudry Mayhew, Student SLP Office: [phone]  01/09/2020, 9:36 AM

## 2020-01-09 NOTE — Progress Notes (Signed)
Cindy Wear, POA was called and given an update on new room assignment.

## 2020-01-09 NOTE — Progress Notes (Signed)
STROKE TEAM PROGRESS NOTE   INTERVAL HISTORY Patient is awake alert and interactive.  He still has left hemiparesis but appears to be improving.  Mild left-sided inattention persist.  Follow-up CT scan of the brain done late last night shows right thalamic infarct.  No hemorrhage.  Vital signs are stable blood pressure adequately controlled.  CT angiogram showed 70% proximal right ICA and 50% left ICA stenosis.  MRI scan is pending  Vitals:   01/09/20 0800 01/09/20 0900 01/09/20 1000 01/09/20 1100  BP: (!) 170/89 (!) 177/77  (!) 163/85  Pulse: 63 (!) 58 79 60  Resp:    (!) 24  Temp: 98.1 F (36.7 C)   99.4 F (37.4 C)  TempSrc: Axillary     SpO2: 91% 94% 97% 93%  Weight:      Height:        CBC:  Recent Labs  Lab 01/07/20 2336 01/07/20 2348  WBC 7.7  --   NEUTROABS 5.1  --   HGB 14.8 15.0  HCT 46.2 44.0  MCV 88.7  --   PLT 201  --     Basic Metabolic Panel:  Recent Labs  Lab 01/07/20 2336 01/07/20 2348  NA 138 138  K 3.8 3.5  CL 103 100  CO2 27  --   GLUCOSE 188* 185*  BUN 19 21  CREATININE 1.17 1.20  CALCIUM 9.0  --    Lipid Panel:     Component Value Date/Time   CHOL 190 01/08/2020 0346   TRIG 111 01/08/2020 0346   HDL 60 01/08/2020 0346   CHOLHDL 3.2 01/08/2020 0346   VLDL 22 01/08/2020 0346   LDLCALC 108 (H) 01/08/2020 0346   HgbA1c:  Lab Results  Component Value Date   HGBA1C 8.1 (H) 01/08/2020   Urine Drug Screen: No results found for: LABOPIA, COCAINSCRNUR, LABBENZ, AMPHETMU, THCU, LABBARB  Alcohol Level     Component Value Date/Time   ETH <10 01/07/2020 2336    IMAGING past 24 hours CT ANGIO HEAD W OR WO CONTRAST  Result Date: 01/08/2020 CLINICAL DATA:  Stroke EXAM: CT ANGIOGRAPHY HEAD AND NECK TECHNIQUE: Multidetector CT imaging of the head and neck was performed using the standard protocol during bolus administration of intravenous contrast. Multiplanar CT image reconstructions and MIPs were obtained to evaluate the vascular anatomy.  Carotid stenosis measurements (when applicable) are obtained utilizing NASCET criteria, using the distal internal carotid diameter as the denominator. CONTRAST:  60mL OMNIPAQUE IOHEXOL 350 MG/ML SOLN COMPARISON:  CT head 01/07/2020 FINDINGS: Image interpretation was delayed for approximately 19 hours due to delay in completing the exam by the technologist. CTA NECK FINDINGS Aortic arch: Top of the arch was imaged. Proximal great vessels patent. Atherosclerotic disease in the subclavian arteries bilaterally left greater than right without significant stenosis. Right carotid system: Right common carotid artery widely patent. Atherosclerotic calcification right carotid bulb. Proximal right internal carotid artery narrowed to 1.26 mm corresponding to approximately 70% diameter stenosis. Left carotid system: Atherosclerotic calcification left carotid bifurcation. Minimal luminal diameter is 1.95 mm corresponding to 50% diameter stenosis. Vertebral arteries: Both vertebral arteries are patent to the basilar. Mild stenosis origin of the vertebral artery bilaterally Skeleton: Moderate degenerative change in the cervical spine. No acute skeletal abnormality. Other neck: Right thyroid mass measuring 35 x 25 mm. Small calcifications in the mass. No adenopathy in the neck. Upper chest: Negative Review of the MIP images confirms the above findings CTA HEAD FINDINGS Anterior circulation: Atherosclerotic calcification in the cavernous  carotid bilaterally. Moderate stenosis of the right supra supraclinoid internal carotid artery and mild to moderate stenosis left cavernous carotid. Anterior and middle cerebral arteries patent bilaterally with without significant stenosis. Mild atherosclerotic disease in the MCA branches bilaterally. Posterior circulation: Both vertebral arteries patent to the basilar. Mild atherosclerotic calcification distal basilar bilaterally. Basilar is diffusely diseased without significant stenosis. PICA  patent bilaterally. Right AICA patent. Superior cerebellar and posterior cerebral arteries patent bilaterally. Mild atherosclerotic disease in the posterior cerebral arteries bilaterally. Venous sinuses: Limited venous enhancement due to timing of the scan. Anatomic variants: None Review of the MIP images confirms the above findings IMPRESSION: 1. Interpretation this exam was delayed by approximately 19 hours due to delayed exam completion by the technologist. 2. 70% diameter stenosis proximal right internal carotid artery. 3. 50% diameter stenosis proximal left internal carotid artery 4. Both vertebral arteries patent to the basilar. Mild stenosis in the proximal and distal vertebral arteries bilaterally. 5. Mild intracranial atherosclerotic disease without large vessel occlusion. 6. Right thyroid mass. Thyroid ultrasound recommended. (Ref: J Am Coll Radiol. 2015 Feb;12(2): 143-50). 7. These results were called by telephone at the time of interpretation on 01/08/2020 at 5:26 pm to provider Pearlean Brownie, who verbally acknowledged these results. Electronically Signed   By: Marlan Palau M.D.   On: 01/08/2020 17:26   CT HEAD WO CONTRAST  Result Date: 01/08/2020 CLINICAL DATA:  Stroke follow-up EXAM: CT HEAD WITHOUT CONTRAST TECHNIQUE: Contiguous axial images were obtained from the base of the skull through the vertex without intravenous contrast. COMPARISON:  Head CT 01/07/2020 FINDINGS: Brain: Area of hypoattenuation along the anterior margin of the right thalamus has increased since the prior study. Otherwise, there is diffuse hypoattenuation of the periventricular white matter consistent with chronic microvascular disease. No acute hemorrhage. No midline shift or other mass effect. No hydrocephalus. Vascular: No hyperdense vessel or unexpected calcification. Skull: Normal. Negative for fracture or focal lesion. Sinuses/Orbits: No acute finding. Other: None. IMPRESSION: Area of hypoattenuation along the anterior margin  of the right thalamus has increased since the prior study, likely an evolving infarct. No acute hemorrhage. Electronically Signed   By: Deatra Robinson M.D.   On: 01/08/2020 23:35   CT ANGIO NECK W OR WO CONTRAST  Result Date: 01/08/2020 CLINICAL DATA:  Stroke EXAM: CT ANGIOGRAPHY HEAD AND NECK TECHNIQUE: Multidetector CT imaging of the head and neck was performed using the standard protocol during bolus administration of intravenous contrast. Multiplanar CT image reconstructions and MIPs were obtained to evaluate the vascular anatomy. Carotid stenosis measurements (when applicable) are obtained utilizing NASCET criteria, using the distal internal carotid diameter as the denominator. CONTRAST:  32mL OMNIPAQUE IOHEXOL 350 MG/ML SOLN COMPARISON:  CT head 01/07/2020 FINDINGS: Image interpretation was delayed for approximately 19 hours due to delay in completing the exam by the technologist. CTA NECK FINDINGS Aortic arch: Top of the arch was imaged. Proximal great vessels patent. Atherosclerotic disease in the subclavian arteries bilaterally left greater than right without significant stenosis. Right carotid system: Right common carotid artery widely patent. Atherosclerotic calcification right carotid bulb. Proximal right internal carotid artery narrowed to 1.26 mm corresponding to approximately 70% diameter stenosis. Left carotid system: Atherosclerotic calcification left carotid bifurcation. Minimal luminal diameter is 1.95 mm corresponding to 50% diameter stenosis. Vertebral arteries: Both vertebral arteries are patent to the basilar. Mild stenosis origin of the vertebral artery bilaterally Skeleton: Moderate degenerative change in the cervical spine. No acute skeletal abnormality. Other neck: Right thyroid mass measuring 35 x 25  mm. Small calcifications in the mass. No adenopathy in the neck. Upper chest: Negative Review of the MIP images confirms the above findings CTA HEAD FINDINGS Anterior circulation:  Atherosclerotic calcification in the cavernous carotid bilaterally. Moderate stenosis of the right supra supraclinoid internal carotid artery and mild to moderate stenosis left cavernous carotid. Anterior and middle cerebral arteries patent bilaterally with without significant stenosis. Mild atherosclerotic disease in the MCA branches bilaterally. Posterior circulation: Both vertebral arteries patent to the basilar. Mild atherosclerotic calcification distal basilar bilaterally. Basilar is diffusely diseased without significant stenosis. PICA patent bilaterally. Right AICA patent. Superior cerebellar and posterior cerebral arteries patent bilaterally. Mild atherosclerotic disease in the posterior cerebral arteries bilaterally. Venous sinuses: Limited venous enhancement due to timing of the scan. Anatomic variants: None Review of the MIP images confirms the above findings IMPRESSION: 1. Interpretation this exam was delayed by approximately 19 hours due to delayed exam completion by the technologist. 2. 70% diameter stenosis proximal right internal carotid artery. 3. 50% diameter stenosis proximal left internal carotid artery 4. Both vertebral arteries patent to the basilar. Mild stenosis in the proximal and distal vertebral arteries bilaterally. 5. Mild intracranial atherosclerotic disease without large vessel occlusion. 6. Right thyroid mass. Thyroid ultrasound recommended. (Ref: J Am Coll Radiol. 2015 Feb;12(2): 143-50). 7. These results were called by telephone at the time of interpretation on 01/08/2020 at 5:26 pm to provider Pearlean Brownie, who verbally acknowledged these results. Electronically Signed   By: Marlan Palau M.D.   On: 01/08/2020 17:26   DG Swallowing Func-Speech Pathology  Result Date: 01/08/2020 Objective Swallowing Evaluation: Type of Study: MBS-Modified Barium Swallow Study  Patient Details Name: Jeremiah Mejia MRN: [id number] Date of Birth: 1933-04-25 Today's Date: 01/08/2020 Time: SLP Start Time (ACUTE  ONLY): 1320 -SLP Stop Time (ACUTE ONLY): 1346 SLP Time Calculation (min) (ACUTE ONLY): 26 min Past Medical History: No past medical history on file. Past Surgical History: No past surgical history on file. HPI: Jeremiah Mejia is an 86 y.o. male presenting from his Assisted Living Facility via EMS as a Code Stroke after he was found by staff to be weak on his left side. On arrival, he has right sided gaze deviation, left sided weakness and garbled/slurred speech. No history of stroke per staff conversation with EMS. Head CT (4/12) revealed chronic small vessel disease without acute intracranial abnormality. MRI to be completed.  Subjective: alert, cooperative Assessment / Plan / Recommendation CHL IP CLINICAL IMPRESSIONS 01/08/2020 Clinical Impression Patient presents with moderate oropharyngeal dysphagia. Oral phase is remarkable for prolonged mastication and lingual residue. Pharyngeal phase is remarkable for reduced tongue base retraction and epiglottic inversion, leading to reduced laryngeal closure and a moderate amount of vallecular and pyriform sinus residue. The reduced laryngeal clousure and pharyngeal residue resulted in aspiration during and after the swallow with thin (PAS 7) and nectar thick (PAS 6 x1) liquids. The pt attempted to swallow mutliple times to reduce the residue, however it wasn't fully cleared. The pt's current mentation is fluctuating and required cues to maintain adequate alert state by the end of the study. The pt is not currently ready for a full diet, but may have snacks of purees and NTL throughout the day, as long as he is alert and following general aspiration precations. His meds may also be crushed in purees. Will continue to follow to determine readiness for a diet as mentation hopefully clears, and as is clinically appropriate. SLP Visit Diagnosis Dysphagia, oropharyngeal phase (R13.12) Attention and concentration deficit following -- Frontal  lobe and executive function deficit  following -- Impact on safety and function Moderate aspiration risk   CHL IP TREATMENT RECOMMENDATION 01/08/2020 Treatment Recommendations Therapy as outlined in treatment plan below   Prognosis 01/08/2020 Prognosis for Safe Diet Advancement Fair Barriers to Reach Goals Cognitive deficits Barriers/Prognosis Comment -- CHL IP DIET RECOMMENDATION 01/08/2020 SLP Diet Recommendations Other (Comment) Liquid Administration via Straw Medication Administration Crushed with puree Compensations Minimize environmental distractions;Slow rate;Small sips/bites Postural Changes Seated upright at 90 degrees   CHL IP OTHER RECOMMENDATIONS 01/08/2020 Recommended Consults -- Oral Care Recommendations Oral care before and after PO Other Recommendations Prohibited food (jello, ice cream, thin soups)   CHL IP FOLLOW UP RECOMMENDATIONS 01/08/2020 Follow up Recommendations Skilled Nursing facility   [facility] IP FREQUENCY AND DURATION 01/08/2020 Speech Therapy Frequency (ACUTE ONLY) min 2x/week Treatment Duration 2 weeks      CHL IP ORAL PHASE 01/08/2020 Oral Phase Impaired Oral - Pudding Teaspoon -- Oral - Pudding Cup -- Oral - Honey Teaspoon -- Oral - Honey Cup -- Oral - Nectar Teaspoon -- Oral - Nectar Cup -- Oral - Nectar Straw Lingual/palatal residue Oral - Thin Teaspoon -- Oral - Thin Cup -- Oral - Thin Straw Lingual/palatal residue Oral - Puree Lingual/palatal residue Oral - Mech Soft -- Oral - Regular Lingual/palatal residue;Impaired mastication Oral - Multi-Consistency -- Oral - Pill -- Oral Phase - Comment --  CHL IP PHARYNGEAL PHASE 01/08/2020 Pharyngeal Phase Impaired Pharyngeal- Pudding Teaspoon -- Pharyngeal -- Pharyngeal- Pudding Cup -- Pharyngeal -- Pharyngeal- Honey Teaspoon -- Pharyngeal -- Pharyngeal- Honey Cup -- Pharyngeal -- Pharyngeal- Nectar Teaspoon -- Pharyngeal -- Pharyngeal- Nectar Cup -- Pharyngeal -- Pharyngeal- Nectar Straw Penetration/Aspiration during swallow;Penetration/Apiration after swallow;Pharyngeal residue -  valleculae;Pharyngeal residue - pyriform;Trace aspiration;Reduced tongue base retraction;Reduced airway/laryngeal closure Pharyngeal Material enters airway, passes BELOW cords then ejected out Pharyngeal- Thin Teaspoon -- Pharyngeal -- Pharyngeal- Thin Cup -- Pharyngeal -- Pharyngeal- Thin Straw Reduced airway/laryngeal closure;Penetration/Aspiration during swallow;Penetration/Apiration after swallow;Trace aspiration;Pharyngeal residue - valleculae;Pharyngeal residue - pyriform Pharyngeal Material enters airway, passes BELOW cords and not ejected out despite cough attempt by patient Pharyngeal- Puree Pharyngeal residue - valleculae;Pharyngeal residue - pyriform;Reduced tongue base retraction Pharyngeal -- Pharyngeal- Mechanical Soft -- Pharyngeal -- Pharyngeal- Regular Pharyngeal residue - valleculae;Pharyngeal residue - pyriform Pharyngeal -- Pharyngeal- Multi-consistency -- Pharyngeal -- Pharyngeal- Pill -- Pharyngeal -- Pharyngeal Comment --  CHL IP CERVICAL ESOPHAGEAL PHASE 01/08/2020 Cervical Esophageal Phase WFL Pudding Teaspoon -- Pudding Cup -- Honey Teaspoon -- Honey Cup -- Nectar Teaspoon -- Nectar Cup -- Nectar Straw -- Thin Teaspoon -- Thin Cup -- Thin Straw -- Puree -- Mechanical Soft -- Regular -- Multi-consistency -- Pill -- Cervical Esophageal Comment -- Mahala Menghini., M.A. CCC-SLP Acute Rehabilitation Services Pager [phone] Office [phone] 01/08/2020, 2:42 PM               PHYSICAL EXAM Pleasant elderly Caucasian male not in distress. . Afebrile. Head is nontraumatic. Neck is supple without bruit.    Cardiac exam no murmur or gallop. Lungs are clear to auscultation. Distal pulses are well felt. Neurological Exam : Patient is awake alert oriented to person only.  Diminished attention, registration and recall.  Mildly confused.  Follows only simple midline commands.  No aphasia.  Right gaze preference but able to look to the left past midline.  Partial left visual field defect.  Blinks to  threat on the right but not so on the left.  Mild left facial weakness.  Tongue midline.  Motor system exam shows  left hemiparesis grade 3/5 strength with mild left-sided drift.  Significant weakness of left grip and intrinsic hand muscles.  Normal strength on the right.  Mild diminished sensation on the left compared to the right with some left sensory inattention.  Tone is diminished on the left compared to the right.  Left plantar upgoing right downgoing.  Gait not tested. ASSESSMENT/PLAN [name] is a 86 y.o. male with historyCori Mejia of prostate cancer s/p prostatectomy, DB, HTN, HLD, dementia presenting from [facility] with L sided weakness and slurred speech. Received IV tPA 4/12/20201 at 2350.   Right hemispheric subcortical infarct s/p IV TPA with some clinical improvement.  Suspect patient has dementia at baseline.  Code Stroke CT head No acute abnormality. Chronic Small vessel disease. ASPECTS 10.     CTA head & neck 70% proximal right ICA and 50% left ICA stenosis.  Mild vertebral origin stenosis.  MRI pending  2D Echo EF 55-60%. No source of embolus   LDL 108  HgbA1c 8.1  SCDs for VTE prophylaxis  No antithrombotic prior to admission, now on No antithrombotic as within 24h of tPA administration. Hx hematuria requiring hospitalization. See Care Everywhere.   Therapy recommendations:     SNF level.  Very long time [id number] the last admission the first 2 places disposition:  pending  (from [facility])  DNR - out of facility order accompanied pt  Hypertension  Home meds:  lisinopril 20 BP goal per post tPA protocol x 24h following tPA administration . Long-term BP goal normotensive  Hyperlipidemia  Home meds:  pravachol 20, resumed in hospital  LDL 108, goal < 70  Increase pravachol to 40  Continue statin at discharge  Diabetes type II   Found documentation of DB hx, but not on meds:    HgbA1c pending, goal < 7.0  CBGs Recent Labs    01/09/20 0314  01/09/20 0738 01/09/20 1130  GLUCAP 133* 150* 143*      SSI  Dysphagia . Secondary to stroke . NPO . Speech on board  Other Stroke Risk Factors  Advanced age  Obesity, Body mass index is 29.09 kg/m., recommend weight loss, diet and exercise as appropriate   Other Active Problems  Baseline dementia on aricept  Depression on effexor   Constipation on miralax  Urinary incontinence of mirabegron   Hx prostate cancer 2002 s/p prostatectomy 2006. Admitted for hematuria 07/2019 which resolved w/ tx, foley.  Hospital day # 1 Mobilize out of bed.  Therapy consults.  Transfer out of ICU to a regular floor bed.  Check MRI scan of the brain later today.  Aspirin for stroke prevention.  Greater than 50% time during this 35-minute visit was spent on counseling and coordination of care and discussion with care team. Delia HeadyPramod Aileen Amore, MD  To contact Stroke Continuity provider, please refer to [email or web address]. After hours, contact General Neurology

## 2020-01-09 NOTE — Evaluation (Signed)
Physical Therapy Evaluation Patient Details Name: Jeremiah Mejia MRN: [id number] DOB: 1933-09-02 Today's Date: 01/09/2020   History of Present Illness  86 yo male s/p IV TPA for R MCA CVA. Pending MRI  PMH dementia,   Clinical Impression  Pt with left sided strength deficits, difficulty standing and taking pivotal steps to the chair due to buckling over left leg.  He has h/o dementia, likely superimposed on new cognitive deficits from the stroke.  He would benefit from post acute rehab before returning to ALF level of independence/care.   PT to follow acutely for deficits listed below.      Follow Up Recommendations SNF    Equipment Recommendations  None recommended by PT    Recommendations for Other Services   NA    Precautions / Restrictions Precautions Precautions: Fall Precaution Comments: left sided weakness      Mobility  Bed Mobility Overal bed mobility: Needs Assistance Bed Mobility: Rolling;Supine to Sit Rolling: Mod assist   Supine to sit: +2 for physical assistance;Mod assist;HOB elevated     General bed mobility comments: pt pulling with L UE with uncontrolled movement. pt with support at L ankle and pushing down through knee to shift toward R side of bed. PT requires use of pad to shift hips fully. Pt requires (A) to lift trunk from elevated bed surface and sustain eob sitting. pt reaching iwth L UE for footboard. pt is able to shift hips to square off at EOB.  Multimodal cues for sequencing.   Transfers Overall transfer level: Needs assistance Equipment used: 2 person hand held assist Transfers: Sit to/from [facility] Sit to Stand: +2 physical assistance;Mod assist Stand pivot transfers: +2 physical assistance;Max assist       General transfer comment: Pt requires (A) to elevated from surface but able to initiate. Pt unable to sustain static standing without support. pt needs cues for trunk activation for elongate trunk, left knee blocked due  to weakness, mild buckling over left knee in stance and difficulty progressing foot around for coordinated stepping.          Balance Overall balance assessment: Needs assistance Sitting-balance support: Bilateral upper extremity supported;Feet supported Sitting balance-Leahy Scale: Fair Sitting balance - Comments: posterior lean with posterior pelvic tilt and bed elevated to help keep neutral alignment Postural control: Posterior lean Standing balance support: Bilateral upper extremity supported;During functional activity Standing balance-Leahy Scale: Poor Standing balance comment: requires LLE blocked due to unable to sustain knee extension                             Pertinent Vitals/Pain Pain Assessment: No/denies pain    Home Living Family/patient expects to be discharged to:: Assisted living     Type of Home: Assisted living           Additional Comments: Pt is a poor historian, states he walks with a walker at baseline, but is a bit confused.     Prior Function Level of Independence: Independent with assistive device(s)         Comments: uses a rollator per patient report/ previous profession was a Radio producer.      Hand Dominance   Dominant Hand: Right    Extremity/Trunk Assessment   Upper Extremity Assessment Upper Extremity Assessment: Defer to OT evaluation RUE Deficits / Details: WFL for task assessed LUE Deficits / Details: noted to have hand held in fist but able to open on command,.ataxic  movement. pt undershooting when reaching,  LUE Sensation: decreased proprioception LUE Coordination: decreased fine motor;decreased gross motor    Lower Extremity Assessment Lower Extremity Assessment: LLE deficits/detail LLE Deficits / Details: R leg is generally weak, however, left leg is functionally weaker than right, with soft knee when stepping around to the chair as well as decreased gross coordination when stepping of L leg.  Pt has  difficulty lifting his leg against gravity 3-/5 LLE Coordination: decreased gross motor    Cervical / Trunk Assessment Cervical / Trunk Assessment: Kyphotic  Communication   Communication: Expressive difficulties  Cognition Arousal/Alertness: Awake/alert Behavior During Therapy: WFL for tasks assessed/performed Overall Cognitive Status: History of cognitive impairments - at baseline                                 General Comments: pt singing at times during session. pt able to state he has a stroke. pt oriented to self only . Unsure of baseline given PMH of dementia, decreased awareness of his current deficits.       General Comments General comments (skin integrity, edema, etc.): increase risk for skin break down so bed mobility and frequent change of position are required.         Assessment/Plan    PT Assessment Patient needs continued PT services  PT Problem List Decreased strength;Decreased activity tolerance;Decreased balance;Decreased mobility;Decreased cognition;Decreased safety awareness;Decreased knowledge of precautions       PT Treatment Interventions DME instruction;Gait training;Functional mobility training;Therapeutic activities;Therapeutic exercise;Balance training;Neuromuscular re-education;Patient/family education;Cognitive remediation    PT Goals (Current goals can be found in the Care Plan section)  Acute Rehab PT Goals Patient Stated Goal: Pt expressed wanting to nap and read his book.  PT Goal Formulation: With patient Time For Goal Achievement: 01/23/20 Potential to Achieve Goals: Good    Frequency Min 3X/week        Co-evaluation PT/OT/SLP Co-Evaluation/Treatment: Yes Reason for Co-Treatment: Complexity of the patient's impairments (multi-system involvement);Necessary to address cognition/behavior during functional activity;For patient/therapist safety;To address functional/ADL transfers PT goals addressed during session:  Mobility/safety with mobility;Balance;Strengthening/ROM OT goals addressed during session: ADL's and self-care;Strengthening/ROM       AM-PAC PT "6 Clicks" Mobility  Outcome Measure Help needed turning from your back to your side while in a flat bed without using bedrails?: A Lot Help needed moving from lying on your back to sitting on the side of a flat bed without using bedrails?: A Lot Help needed moving to and from a bed to a chair (including a wheelchair)?: Total Help needed standing up from a chair using your arms (e.g., wheelchair or bedside chair)?: A Lot Help needed to walk in hospital room?: Total Help needed climbing 3-5 steps with a railing? : Total 6 Click Score: 9    End of Session   Activity Tolerance: Patient limited by fatigue Patient left: in chair;with call bell/phone within reach;with chair alarm set Nurse Communication: Mobility status PT Visit Diagnosis: Muscle weakness (generalized) (M62.81);Difficulty in walking, not elsewhere classified (R26.2);Hemiplegia and hemiparesis Hemiplegia - Right/Left: Left Hemiplegia - dominant/non-dominant: Non-dominant Hemiplegia - caused by: Cerebral infarction    Time: 7939-0300 PT Time Calculation (min) (ACUTE ONLY): 23 min   Charges:          Corinna Capra, PT, DPT  Acute Rehabilitation [phone] pager #[phone] office     PT Evaluation $PT Eval Moderate Complexity: 1 Mod  01/09/2020, 1:41 PM

## 2020-01-09 NOTE — Progress Notes (Signed)
Called Dr Otelia Limes, on call, because has been attempting to climb out of bed, has been combative, yelling, and threatening to leave.  Nothing else is working to calm him down.   Did remove the SCDs at this time as well because they just seemed to agitate him more.  No signs of resp distress.  No pain.

## 2020-01-09 NOTE — Clinical Social Work Note (Signed)
Per Alphonzo Lemmings [phone] admissions coordinator at [facility] patient resides at ALF. However, at discharge he will go to the SNF side.

## 2020-01-09 NOTE — Evaluation (Signed)
Occupational Therapy Evaluation Patient Details Name: Jeremiah Mejia MRN: [id number] DOB: [birth year] Today's Date: 01/09/2020    History of Present Illness 86 yo male s/p IV TPA R MCA. Pending MRI  PMH dementia,    Clinical Impression   PT admitted with R MCA Infarct. Pt currently with functional limitiations due to the deficits listed below (see OT problem list). Pt requires total +2 Max (A) to transfer to chair with blocking LLE.  Pt will benefit from skilled OT to increase their independence and safety with adls and balance to allow discharge SNF.     Follow Up Recommendations  SNF    Equipment Recommendations  3 in 1 bedside commode    Recommendations for Other Services       Precautions / Restrictions Precautions Precautions: Fall      Mobility Bed Mobility Overal bed mobility: Needs Assistance Bed Mobility: Rolling;Supine to Sit Rolling: Mod assist   Supine to sit: +2 for physical assistance;Mod assist;HOB elevated     General bed mobility comments: pt pulling with L UE with uncontrolled movement. pt with support at L ankle and pushing down through knee to shift toward R side of bed. PT requires use of pad to shift hips fully. Pt requires (A) to lift trunk from elevated bed surface and sustain eob sitting. pt reaching iwth L UE for footboard. pt is able to shift hips to square off at EOB  Transfers Overall transfer level: Needs assistance Equipment used: 2 person hand held assist Transfers: Sit to/from [employer] Sit to Stand: +2 physical assistance;Mod assist Stand pivot transfers: +2 physical assistance;Max assist       General transfer comment: Pt requires (A) to elevated from surface but able to initiate. Pt unable to sustain static standing without support. pt needs cues for trunk activation for elongate trunk    Balance Overall balance assessment: Needs assistance Sitting-balance support: Bilateral upper extremity supported;Feet  supported Sitting balance-Leahy Scale: Fair Sitting balance - Comments: posterior lean with posterior pelvic tilt and bed elevated to help keep neutral alignment Postural control: Posterior lean Standing balance support: Bilateral upper extremity supported;During functional activity Standing balance-Leahy Scale: Poor Standing balance comment: requires LLE blocked due to unable to sustain knee extension                           ADL either performed or assessed with clinical judgement   ADL Overall ADL's : Needs assistance/impaired Eating/Feeding: NPO   Grooming: Wash/dry face;Minimal assistance;Sitting   Upper Body Bathing: Moderate assistance;Sitting   Lower Body Bathing: Maximal assistance;Sitting/lateral leans       Lower Body Dressing: Maximal assistance Lower Body Dressing Details (indicate cue type and reason): attempting to thread and undershooting. pt able to pull up once started Toilet Transfer: +2 for physical assistance;Moderate assistance;Stand-pivot Toilet Transfer Details (indicate cue type and reason): needs weight shift           General ADL Comments: Pt unable to shift weight without cues from therapist to chair. pt demonstrates ataxic movement     Vision Baseline Vision/History: Wears glasses Wears Glasses: At all times Vision Assessment?: Yes Eye Alignment: Within Functional Limits Alignment/Gaze Preference: Gaze right Tracking/Visual Pursuits: Decreased smoothness of horizontal tracking;Requires cues, head turns, or add eye shifts to track;Impaired - to be further tested in functional context Additional Comments: pt requires trunk rotation and neck rotation to track toward the L visual field. pt does cross midline  Perception     Praxis      Pertinent Vitals/Pain Pain Assessment: No/denies pain     Hand Dominance Right   Extremity/Trunk Assessment Upper Extremity Assessment Upper Extremity Assessment: RUE deficits/detail;LUE  deficits/detail RUE Deficits / Details: WFL for task assessed LUE Deficits / Details: noted to have hand held in fist but able to open on command,.ataxic movement. pt undershooting when reaching,  LUE Sensation: decreased proprioception LUE Coordination: decreased fine motor;decreased gross motor   Lower Extremity Assessment Lower Extremity Assessment: Defer to PT evaluation   Cervical / Trunk Assessment Cervical / Trunk Assessment: Kyphotic   Communication Communication Communication: Expressive difficulties   Cognition Arousal/Alertness: Awake/alert Behavior During Therapy: WFL for tasks assessed/performed Overall Cognitive Status: History of cognitive impairments - at baseline                                 General Comments: pt singing at times during session. pt able to state he has a stroke. pt oriented to self only .    General Comments  increase risk for skin break down so bed mobility and frequent change of position are required.     Exercises     Shoulder Instructions      Home Living Family/patient expects to be discharged to:: Skilled nursing facility                                        Prior Functioning/Environment Level of Independence: Independent with assistive device(s)        Comments: uses a rollator per patient report/ previous a priest for [employer]         OT Problem List: Decreased strength;Decreased activity tolerance;Impaired balance (sitting and/or standing);Decreased safety awareness;Decreased knowledge of use of DME or AE;Decreased knowledge of precautions;Obesity;Decreased cognition      OT Treatment/Interventions: Self-care/ADL training;Therapeutic exercise;DME and/or AE instruction;Therapeutic activities;Balance training;Patient/family education;Cognitive remediation/compensation;Neuromuscular education    OT Goals(Current goals can be found in the care plan section) Acute Rehab OT Goals Patient Stated  Goal: Pt expressed wanting to nap and read his book.  OT Goal Formulation: Patient unable to participate in goal setting Time For Goal Achievement: 01/23/20 Potential to Achieve Goals: Good  OT Frequency: Min 2X/week   Barriers to D/C:            Co-evaluation PT/OT/SLP Co-Evaluation/Treatment: Yes Reason for Co-Treatment: Necessary to address cognition/behavior during functional activity;For patient/therapist safety;To address functional/ADL transfers   OT goals addressed during session: ADL's and self-care;Strengthening/ROM      AM-PAC OT "6 Clicks" Daily Activity     Outcome Measure Help from another person eating meals?: A Lot Help from another person taking care of personal grooming?: A Lot Help from another person toileting, which includes using toliet, bedpan, or urinal?: Total Help from another person bathing (including washing, rinsing, drying)?: Total Help from another person to put on and taking off regular upper body clothing?: A Lot Help from another person to put on and taking off regular lower body clothing?: A Lot 6 Click Score: 10   End of Session Nurse Communication: Mobility status;Precautions  Activity Tolerance: Patient tolerated treatment well Patient left: in chair;with call bell/phone within reach;with chair alarm set  OT Visit Diagnosis: Unsteadiness on feet (R26.81);Muscle weakness (generalized) (M62.81)  Time: 3382-5053 OT Time Calculation (min): 23 min Charges:  OT General Charges $OT Visit: 1 Visit OT Evaluation $OT Eval Moderate Complexity: 1 Mod   Brynn, OTR/L  Acute Rehabilitation Services Pager: [phone] Office: [phone] .   Jeri Modena 01/09/2020, 11:33 AM

## 2020-01-10 DIAGNOSIS — I639 Cerebral infarction, unspecified: Secondary | ICD-10-CM | POA: Diagnosis not present

## 2020-01-10 LAB — GLUCOSE, CAPILLARY
Glucose-Capillary: 121 mg/dL — ABNORMAL HIGH (ref 70–99)
Glucose-Capillary: 144 mg/dL — ABNORMAL HIGH (ref 70–99)
Glucose-Capillary: 144 mg/dL — ABNORMAL HIGH (ref 70–99)
Glucose-Capillary: 116 mg/dL — ABNORMAL HIGH (ref 70–99)
Glucose-Capillary: 108 mg/dL — ABNORMAL HIGH (ref 70–99)

## 2020-01-10 NOTE — Progress Notes (Signed)
Asleep at this time.  Finally fell asleep after several hours of trying to get up out of the bed, moving around in the bed, and thinking he needed to leave to go drive the truck home.  No resp distress.

## 2020-01-10 NOTE — Progress Notes (Addendum)
STROKE TEAM PROGRESS NOTE   INTERVAL HISTORY Patient was agitated and up most of last night.  He is sleepy this morning.  He was quite disoriented earlier.  Vital signs are stable.  Vitals:   01/09/20 2328 01/10/20 0349 01/10/20 0831 01/10/20 1211  BP: (!) 156/85 (!) 155/78 (!) 158/76 (!) 128/107  Pulse: 67 67 71 62  Resp: 18 20 20  (!) 21  Temp: 98.4 F (36.9 C) 98.5 F (36.9 C) 98 F (36.7 C) 97.7 F (36.5 C)  TempSrc:  Axillary Axillary Axillary  SpO2: 99% 99% 93% 95%  Weight:      Height:        CBC:  Recent Labs  Lab 01/07/20 2336 01/07/20 2348  WBC 7.7  --   NEUTROABS 5.1  --   HGB 14.8 15.0  HCT 46.2 44.0  MCV 88.7  --   PLT 201  --     Basic Metabolic Panel:  Recent Labs  Lab 01/07/20 2336 01/07/20 2348  NA 138 138  K 3.8 3.5  CL 103 100  CO2 27  --   GLUCOSE 188* 185*  BUN 19 21  CREATININE 1.17 1.20  CALCIUM 9.0  --    Lipid Panel:     Component Value Date/Time   CHOL 190 01/08/2020 0346   TRIG 111 01/08/2020 0346   HDL 60 01/08/2020 0346   CHOLHDL 3.2 01/08/2020 0346   VLDL 22 01/08/2020 0346   LDLCALC 108 (H) 01/08/2020 0346   HgbA1c:  Lab Results  Component Value Date   HGBA1C 8.1 (H) 01/08/2020   Urine Drug Screen: No results found for: LABOPIA, COCAINSCRNUR, LABBENZ, AMPHETMU, THCU, LABBARB  Alcohol Level     Component Value Date/Time   ETH <10 01/07/2020 2336    IMAGING past 24 hours MR BRAIN WO CONTRAST  Result Date: 01/09/2020 CLINICAL DATA:  Stroke follow-up EXAM: MRI HEAD WITHOUT CONTRAST TECHNIQUE: Multiplanar, multiecho pulse sequences of the brain and surrounding structures were obtained without intravenous contrast. COMPARISON:  Head CT 01/08/2020, CTA head and neck 01/07/2020 FINDINGS: Brain: Abnormal diffusion restriction at the ventral aspect of the right thalamus, corresponding to the area of infarction described on earlier head CT. There also multiple punctate foci of diffusion restriction within the superomedial  right hemisphere. Early confluent hyperintense T2-weighted signal of the periventricular and deep white matter, most commonly due to chronic ischemic microangiopathy. There is generalized atrophy without lobar predilection. No chronic microhemorrhage. Normal midline structures. Vascular: Normal flow voids. Skull and upper cervical spine: Normal marrow signal. Sinuses/Orbits: Normal sinus and orbits. Small amount of right mastoid fluid. Other: None IMPRESSION: 1. Acute infarct at the ventral aspect of the right thalamus, in close proximity to the posterior limb of the right internal capsule. This could cause left-sided weakness. 2. Additional punctate foci of acute ischemia within the superomedial right hemisphere. 3. No hemorrhage or mass effect. Electronically Signed   By: 03/08/2020 M.D.   On: 01/09/2020 19:24    PHYSICAL EXAM Pleasant elderly Caucasian male not in distress. . Afebrile. Head is nontraumatic. Neck is supple without bruit.    Cardiac exam no murmur or gallop. Lungs are clear to auscultation. Distal pulses are well felt. Neurological Exam : Patient is very sleepy and can barely be aroused and only mumbles follows only simple midline commands.  No aphasia.  Right gaze preference but able to look to the left past midline.  Partial left visual field defect.  Blinks to threat on the right but  not so on the left.  Mild left facial weakness.  Tongue midline.  Motor system exam shows left hemiparesis grade 3/5 strength with mild left-sided drift.  Significant weakness of left grip and intrinsic hand muscles.  Normal strength on the right.  Mild diminished sensation on the left compared to the right with some left sensory inattention.  Tone is diminished on the left compared to the right.  Left plantar upgoing right downgoing.  Gait not tested. ASSESSMENT/PLAN [name] is a 86 y.o. male with history of prostate cancer s/p prostatectomy, DB, HTN, HLD, dementia presenting from [facility]  with L sided weakness and slurred speech. Received IV tPA 4/12/20201 at 2350.   Right hemispheric subcortical infarct s/p IV TPA with some clinical improvement.  Suspect patient has dementia at baseline.  Code Stroke CT head No acute abnormality. Chronic Small vessel disease. ASPECTS 10.     CTA head & neck 70% proximal right ICA and 50% left ICA stenosis.  Mild vertebral origin stenosis.  MRI large right thalamic and subcortical infarct  2D Echo EF 55-60%. No source of embolus   LDL 108  HgbA1c 8.1  SCDs for VTE prophylaxis  No antithrombotic prior to admission, now on No antithrombotic as within 24h of tPA administration. Hx hematuria requiring hospitalization. See Care Everywhere.   Therapy recommendations:     SNF level.  Very long time [id number] the last admission the first 2 places disposition:  pending  (from [facility])  DNR - out of facility order accompanied pt  Hypertension  Home meds:  lisinopril 20 BP goal per post tPA protocol x 24h following tPA administration . Long-term BP goal normotensive  Hyperlipidemia  Home meds:  pravachol 20, resumed in hospital  LDL 108, goal < 70  Increase pravachol to 40  Continue statin at discharge  Diabetes type II   Found documentation of DB hx, but not on meds:    HgbA1c pending, goal < 7.0  CBGs Recent Labs    01/10/20 0505 01/10/20 0812 01/10/20 1207  GLUCAP 121* 144* 144*      SSI  Dysphagia . Secondary to stroke . NPO . Speech on board  Other Stroke Risk Factors  Advanced age  Obesity, Body mass index is 29.09 kg/m., recommend weight loss, diet and exercise as appropriate   Other Active Problems  Baseline dementia on aricept  Depression on effexor   Constipation on miralax  Urinary incontinence of mirabegron   Hx prostate cancer 2002 s/p prostatectomy 2006. Admitted for hematuria 07/2019 which resolved w/ tx, foley.  Hospital day # 2 Mobilize out of bed.  Therapy consults.   Regularize sleep-wake cycle.  Aspirin for stroke prevention.  May need to be transferred to skilled nursing facility for rehab.  He may need prolonged cardiac monitoring for paroxysmal A. fib but will make decision after discussion of  plan of care with family.   Antony Contras, MD  To contact Stroke Continuity provider, please refer to [email or web address] After hours, contact General Neurology

## 2020-01-11 DIAGNOSIS — I639 Cerebral infarction, unspecified: Secondary | ICD-10-CM | POA: Diagnosis not present

## 2020-01-11 LAB — GLUCOSE, CAPILLARY
Glucose-Capillary: 111 mg/dL — ABNORMAL HIGH (ref 70–99)
Glucose-Capillary: 112 mg/dL — ABNORMAL HIGH (ref 70–99)
Glucose-Capillary: 116 mg/dL — ABNORMAL HIGH (ref 70–99)
Glucose-Capillary: 122 mg/dL — ABNORMAL HIGH (ref 70–99)
Glucose-Capillary: 125 mg/dL — ABNORMAL HIGH (ref 70–99)
Glucose-Capillary: 142 mg/dL — ABNORMAL HIGH (ref 70–99)
Glucose-Capillary: 169 mg/dL — ABNORMAL HIGH (ref 70–99)
Glucose-Capillary: 99 mg/dL (ref 70–99)

## 2020-01-11 MED ORDER — SODIUM CHLORIDE 0.9 % IV SOLN: INTRAVENOUS | Status: DC

## 2020-01-11 MED ORDER — QUETIAPINE FUMARATE 25 MG PO TABS
25.0000 mg | ORAL_TABLET | Freq: Every day | ORAL | Status: DC
  Administered 2020-01-11 – 2020-01-12 (×2): 25 mg via ORAL
  Filled 2020-01-11 (×2): qty 1

## 2020-01-11 NOTE — Progress Notes (Signed)
Physical Therapy Treatment Patient Details Name: Jeremiah Mejia MRN: [id number] DOB: 1932-12-15 Today's Date: 01/11/2020    History of Present Illness 86 yo male s/p IV TPA for R MCA CVA. Pending MRI  PMH dementia,     PT Comments    Patient lethargic upon arrival but became more engaged when mobility initiated. Pt presents with L side weakness and L lateral bias in sitting and standing. Max A +2 and use of Stedy standing frame required for OOB transfer this session. Continue to progress as tolerated with anticipated d/c to SNF for further skilled PT services.    Follow Up Recommendations  SNF     Equipment Recommendations  None recommended by PT    Recommendations for Other Services       Precautions / Restrictions Precautions Precautions: Fall Precaution Comments: left sided weakness Restrictions Weight Bearing Restrictions: No    Mobility  Bed Mobility Overal bed mobility: Needs Assistance Bed Mobility: Supine to Sit     Supine to sit: +2 for physical assistance;Max assist;HOB elevated;Mod assist     General bed mobility comments: multimodal cues for sequencing; assist to bring bilat LE/hips to EOB and to elevate trunk into sitting; pt did not initiate mobility but assisting once initiated by therapist  Transfers Overall transfer level: Needs assistance   Transfers: Sit to/from Stand Sit to Stand: +2 physical assistance;Max assist         General transfer comment: pt with heavy L lateral bias and multimodal cues required to correct to midline; bed pad used for hip extension; max A +2 required to power up into standing and to maintain standing for positioning of standing frame seat  Ambulation/Gait                 Stairs             Wheelchair Mobility    Modified Rankin (Stroke Patients Only)       Balance Overall balance assessment: Needs assistance Sitting-balance support: Bilateral upper extremity supported;Feet supported Sitting  balance-Leahy Scale: Poor   Postural control: Left lateral lean;Posterior lean Standing balance support: Bilateral upper extremity supported Standing balance-Leahy Scale: Zero                              Cognition Arousal/Alertness: Lethargic Behavior During Therapy: WFL for tasks assessed/performed Overall Cognitive Status: History of cognitive impairments - at baseline                                 General Comments: pt with eyes closed and responding to therapist until sitting up then became more engaged; noted pt had a restless night prior to this session and did not sleep much      Exercises      General Comments        Pertinent Vitals/Pain Pain Assessment: Faces Faces Pain Scale: No hurt    Home Living                      Prior Function            PT Goals (current goals can now be found in the care plan section) Progress towards PT goals: Progressing toward goals    Frequency    Min 3X/week      PT Plan Current plan remains appropriate    Co-evaluation  AM-PAC PT "6 Clicks" Mobility   Outcome Measure  Help needed turning from your back to your side while in a flat bed without using bedrails?: A Lot Help needed moving from lying on your back to sitting on the side of a flat bed without using bedrails?: A Lot Help needed moving to and from a bed to a chair (including a wheelchair)?: Total Help needed standing up from a chair using your arms (e.g., wheelchair or bedside chair)?: A Lot Help needed to walk in hospital room?: Total Help needed climbing 3-5 steps with a railing? : Total 6 Click Score: 9    End of Session   Activity Tolerance: Patient limited by lethargy Patient left: in chair;with call bell/phone within reach;with chair alarm set Nurse Communication: Mobility status;Need for lift equipment PT Visit Diagnosis: Muscle weakness (generalized) (M62.81);Difficulty in walking, not  elsewhere classified (R26.2);Hemiplegia and hemiparesis Hemiplegia - Right/Left: Left Hemiplegia - dominant/non-dominant: Non-dominant Hemiplegia - caused by: Cerebral infarction     Time: 1109-1130 PT Time Calculation (min) (ACUTE ONLY): 21 min  Charges:  $Therapeutic Activity: 8-22 mins                     Earney Navy, PTA Acute Rehabilitation Services Pager: [phone] Office: [phone]     Darliss Cheney 01/11/2020, 1:52 PM

## 2020-01-11 NOTE — TOC Initial Note (Addendum)
Transition of Care [facility]) - Initial/Assessment Note    Patient Details  Name: Jeremiah Mejia MRN: [id number] Date of Birth: 24-Apr-1933  Transition of [facility]) CM/SW Contact:    Baldemar Lenis, LCSW Phone Number: 01/11/2020, 1:07 PM  Clinical Narrative:    CSW spoke with patient's POA, Cindy, to confirm plan to return to [facility] for rehab. Per Arline Asp, she has already been in communication with [facility] about him returning with rehab in place. CSW confirmed with [facility] that they are following to place him in SNF upon return, with barrier being the patient's speech at this time. Once patient is cleared for a diet, then he can discharge to SNF. CSW to follow.              UPDATE: CSW received call from patient's POA, Arline Asp, after discussion with MD about feeding tube that she would like the patient moved to [facility] tomorrow. Arline Asp said she discussed with Whitney at [facility] about therapies and would like him to work with the speech therapy there to get him moved out of the hospital. CSW to follow.   Expected Discharge Plan: Skilled Nursing Facility Barriers to Discharge: Continued Medical Work up   Patient Goals and CMS Choice Patient states their goals for this hospitalization and ongoing recovery are:: patient unable to participate in goal setting due to disorientation CMS Medicare.gov Compare Post Acute Care list provided to:: Patient Represenative (must comment) Choice offered to / list presented to : [facility] POA / Guardian  Expected Discharge Plan and Services Expected Discharge Plan: Skilled Nursing Facility     Post Acute Care Choice: Skilled Nursing Facility Living arrangements for the past 2 months: Assisted Living Facility                                      Prior Living Arrangements/Services Living arrangements for the past 2 months: Assisted Living Facility Lives with:: Facility Resident Patient language and need for interpreter reviewed:: No Do you feel  safe going back to the place where you live?: Yes      Need for Family Participation in Patient Care: Yes (Comment) Care giver support system in place?: No (comment)   Criminal Activity/Legal Involvement Pertinent to Current Situation/Hospitalization: No - Comment as needed  Activities of Daily Living   ADL Screening (condition at time of admission) Patient's cognitive ability adequate to safely complete daily activities?: No Is the patient deaf or have difficulty hearing?: No Does the patient have difficulty seeing, even when wearing glasses/contacts?: No Does the patient have difficulty concentrating, remembering, or making decisions?: Yes Patient able to express need for assistance with ADLs?: Yes Does the patient have difficulty dressing or bathing?: Yes Independently performs ADLs?: No Communication: Needs assistance Is this a change from baseline?: Change from baseline, expected to last >3 days Dressing (OT): Needs assistance Is this a change from baseline?: Change from baseline, expected to last >3 days Grooming: Needs assistance Is this a change from baseline?: Change from baseline, expected to last >3 days Feeding: Needs assistance Is this a change from baseline?: Change from baseline, expected to last >3 days Bathing: Needs assistance Is this a change from baseline?: Change from baseline, expected to last >3 days Toileting: Needs assistance Is this a change from baseline?: Change from baseline, expected to last >3days In/Out Bed: Needs assistance Is this a change from baseline?: Change from baseline, expected to last >3 days  Walks in Home: Needs assistance Is this a change from baseline?: Change from baseline, expected to last >3 days Does the patient have difficulty walking or climbing stairs?: Yes Weakness of Legs: Left Weakness of Arms/Hands: Left  Permission Sought/Granted Permission sought to share information with : Facility Sport and exercise psychologist, Family  Supports Permission granted to share information with : Yes, Verbal Permission Granted  Share Information with NAME: Leslye Peer  Permission granted to share info w AGENCY: [facility]  Permission granted to share info w Relationship: POA     Emotional Assessment   Attitude/Demeanor/Rapport: Unable to Assess Affect (typically observed): Unable to Assess Orientation: : Oriented to Self Alcohol / Substance Use: Not Applicable Psych Involvement: No (comment)  Admission diagnosis:  Stroke (cerebrum) [facility]) [I63.9] Patient Active Problem List   Diagnosis Date Noted  . Stroke (cerebrum) ([place]) 01/08/2020   PCP:  Javier Glazier, MD Pharmacy:  No Pharmacies Listed    Social Determinants of Health (SDOH) Interventions    Readmission Risk Interventions No flowsheet data found.

## 2020-01-11 NOTE — Progress Notes (Signed)
  Speech Language Pathology Treatment: Dysphagia  Patient Details Name: Jeremiah Mejia MRN: [id number] DOB: 30-Jan-1933 Today's Date: 01/11/2020 Time: 8250-0370 SLP Time Calculation (min) (ACUTE ONLY): 14 min  Assessment / Plan / Recommendation Clinical Impression  Pt was very lethargic/drowsy this session. Pt was provided Max cues for arousal (repositioning, opened blinds, wet wash cloth, oral care). Pt would intermittently wake up and state "I don't want to wake up right now," and immediately fall back to sleep. SLP also brought x1 drop of NTL to the pt's lips to attempt interest the pt in POs, but he remained lethargic. Spoke with RN who reported the pt was "coughing" this am when offered meds crushed in purees and with a NTL wash, but unfortunately could not arouse to offer any POs. Will continue to follow for differential diagnosis of current diet recs (snacks of purees and NTL from floor when more alert), and make changes as appropriate.    HPI HPI: Jeremiah Mejia is an 86 y.o. male presenting from his Assisted Living Facility via EMS as a Code Stroke after he was found by staff to be weak on his left side. On arrival, he has right sided gaze deviation, left sided weakness and garbled/slurred speech. No history of stroke per staff conversation with EMS. Head CT (4/12) revealed chronic small vessel disease without acute intracranial abnormality. MRI to be completed.      SLP Plan  Continue with current plan of care       Recommendations  Diet recommendations: Other(comment)(snacks of puree and nectar thick liquids from floor ) Liquids provided via: Straw;Cup;Teaspoon Medication Administration: Crushed with puree Supervision: Full supervision/cueing for compensatory strategies Compensations: Minimize environmental distractions;Slow rate;Small sips/bites Postural Changes and/or Swallow Maneuvers: Seated upright 90 degrees                Oral Care Recommendations: Oral care BID Follow  up Recommendations: Skilled Nursing facility SLP Visit Diagnosis: Dysphagia, oropharyngeal phase (R13.12) Plan: Continue with current plan of care       GO               Maudry Mayhew, Student SLP Office: [phone]  01/11/2020, 11:15 AM

## 2020-01-11 NOTE — NC FL2 (Addendum)
[place] MEDICAID FL2 LEVEL OF CARE SCREENING TOOL     IDENTIFICATION  Patient Name: Jeremiah Mejia Birthdate: 1933-07-13 Sex: male Admission Date (Current Location): 01/07/2020  [facility] and [place] Number:  Producer, television/film/video and Address:  [facility], [address], [place], [place] [address]      Provider Number: [id number]  Attending Physician Name and Address:  Micki Riley, MD  Relative Name and Phone Number:       Current Level of Care: Hospital Recommended Level of Care: Skilled Nursing Facility Prior Approval Number:    Date Approved/Denied:   PASRR Number: [id number]  Discharge Plan: SNF    Current Diagnoses: Patient Active Problem List   Diagnosis Date Noted  . Stroke (cerebrum) ([facility]) 01/08/2020    Orientation RESPIRATION BLADDER Height & Weight     Self  Normal Incontinent Weight: 214 lb 8.1 oz (97.3 kg) Height:  6' (182.9 cm)  BEHAVIORAL SYMPTOMS/MOOD NEUROLOGICAL BOWEL NUTRITION STATUS      Incontinent Diet(see DC summary)  AMBULATORY STATUS COMMUNICATION OF NEEDS Skin   Extensive Assist Verbally Normal                       Personal Care Assistance Level of Assistance  Bathing, Feeding, Dressing Bathing Assistance: Maximum assistance Feeding assistance: Limited assistance Dressing Assistance: Maximum assistance     Functional Limitations Info  Speech     Speech Info: Impaired(dysarthria)    SPECIAL CARE FACTORS FREQUENCY  PT (By licensed PT), OT (By licensed OT), Speech therapy     PT Frequency: 5x/wk OT Frequency: 5x/wk     Speech Therapy Frequency: 5x/wk      Contractures Contractures Info: Not present    Additional Factors Info  Code Status, Allergies, Psychotropic, Insulin Sliding Scale Code Status Info: DNR Allergies Info: Pseudoephedrine Hcl Psychotropic Info: Buspar 10mg  2x/day; Aricept 10mg  daily at bed; Effexor-XR 150mg  daily at breakfast Insulin Sliding Scale Info: 0-15 units  every 4 hours       Current Medications (01/11/2020):  This is the current hospital active medication list Current Facility-Administered Medications  Medication Dose Route Frequency Provider Last Rate Last Admin  . acetaminophen (TYLENOL) tablet 650 mg  650 mg Oral Q4H PRN , MD   650 mg at 01/11/20 01/13/2020   Or  . acetaminophen (TYLENOL) 160 MG/5ML solution 650 mg  650 mg Per Tube Q4H PRN Micki Riley, MD       Or  . acetaminophen (TYLENOL) suppository 650 mg  650 mg Rectal Q4H PRN 01/13/20, MD      . aspirin chewable tablet 81 mg  81 mg Oral Daily 2952, MD   81 mg at 01/11/20 0917  . busPIRone (BUSPAR) tablet 10 mg  10 mg Oral BID Micki Riley, MD   10 mg at 01/11/20 0917  . chlorhexidine (PERIDEX) 0.12 % solution 15 mL  15 mL Mouth Rinse BID 01/13/20, MD   15 mL at 01/11/20 0921  . clevidipine (CLEVIPREX) infusion 0.5 mg/mL  0-21 mg/hr Intravenous Continuous 01/13/20, MD   Stopped at 01/08/20 [phone]  . clopidogrel (PLAVIX) tablet 75 mg  75 mg Oral Daily Micki Riley, MD   75 mg at 01/11/20 0916  . donepezil (ARICEPT) tablet 10 mg  10 mg Oral QHS 8413, MD   10 mg at 01/10/20 2300  . enoxaparin (LOVENOX) injection 40 mg  40  mg Subcutaneous Q24H Metzger-Cihelka, Desiree, NP   40 mg at 01/10/20 1427  . hydrALAZINE (APRESOLINE) injection 20 mg  20 mg Intravenous Q6H PRN Garvin Fila, MD   20 mg at 01/09/20 0002  . hydrochlorothiazide (MICROZIDE) capsule 12.5 mg  12.5 mg Oral Daily Garvin Fila, MD   12.5 mg at 01/11/20 0916  . insulin aspart (novoLOG) injection 0-15 Units  0-15 Units Subcutaneous Q4H Garvin Fila, MD   2 Units at 01/11/20 0458  . lisinopril (ZESTRIL) tablet 20 mg  20 mg Oral Daily Garvin Fila, MD   20 mg at 01/11/20 0916  . MEDLINE mouth rinse  15 mL Mouth Rinse q12n4p Garvin Fila, MD   15 mL at 01/10/20 1546  . mirabegron ER (MYRBETRIQ) tablet 50 mg  50 mg Oral Daily Antony Contras S, MD      .  pantoprazole (PROTONIX) injection 40 mg  40 mg Intravenous QHS Garvin Fila, MD   40 mg at 01/10/20 2300  . polyethylene glycol (MIRALAX / GLYCOLAX) packet 17 g  17 g Oral Daily PRN Garvin Fila, MD      . pravastatin (PRAVACHOL) tablet 40 mg  40 mg Oral q1800 Garvin Fila, MD   40 mg at 01/10/20 1719  . Resource ThickenUp Clear   Oral PRN Garvin Fila, MD      . senna-docusate (Senokot-S) tablet 1 tablet  1 tablet Oral QHS PRN Garvin Fila, MD      . venlafaxine XR (EFFEXOR-XR) 24 hr capsule 150 mg  150 mg Oral Q breakfast Garvin Fila, MD   150 mg at 01/11/20 5009     Discharge Medications: Please see discharge summary for a list of discharge medications.  Relevant Imaging Results:  Relevant Lab Results:   Additional Information SS#: [id number]  Geralynn Ochs, [place]  I have personally obtained history,examined this patient, reviewed notes, independently viewed imaging studies, participated in medical decision making and plan of care.ROS completed by me personally and pertinent positives fully documented  I have made any additions or clarifications . Antony Contras, MD Medical Director [facility] Pager: [phone] 01/11/2020 2:24 PM

## 2020-01-11 NOTE — Progress Notes (Signed)
Pt sitting up in recliner. Pt assisted to eat a cup of apple sauce and a cup of nectar thick water. Pt has a small amount of coughing after taking in apple sauce and thickened water but mostly tolerated well.

## 2020-01-11 NOTE — Progress Notes (Addendum)
STROKE TEAM PROGRESS NOTE   INTERVAL HISTORY Patient was agitated again last night and up most of last night.  He is sleepy this morning.   Vital signs are stable.  Vitals:   01/11/20 0030 01/11/20 0458 01/11/20 0927 01/11/20 1152  BP: (!) 167/90 (!) 158/71 (!) 184/81 102/69  Pulse: 74 65 (!) 53 63  Resp: 16 18 (!) 22 20  Temp:  98.5 F (36.9 C) (!) 97.5 F (36.4 C) 97.6 F (36.4 C)  TempSrc:  Oral Oral Oral  SpO2: 97% 97% 97% 96%  Weight:      Height:        CBC:  Recent Labs  Lab 01/07/20 2336 01/07/20 2348  WBC 7.7  --   NEUTROABS 5.1  --   HGB 14.8 15.0  HCT 46.2 44.0  MCV 88.7  --   PLT 201  --     Basic Metabolic Panel:  Recent Labs  Lab 01/07/20 2336 01/07/20 2348  NA 138 138  K 3.8 3.5  CL 103 100  CO2 27  --   GLUCOSE 188* 185*  BUN 19 21  CREATININE 1.17 1.20  CALCIUM 9.0  --    Lipid Panel:     Component Value Date/Time   CHOL 190 01/08/2020 0346   TRIG 111 01/08/2020 0346   HDL 60 01/08/2020 0346   CHOLHDL 3.2 01/08/2020 0346   VLDL 22 01/08/2020 0346   LDLCALC 108 (H) 01/08/2020 0346   HgbA1c:  Lab Results  Component Value Date   HGBA1C 8.1 (H) 01/08/2020   Urine Drug Screen: No results found for: LABOPIA, COCAINSCRNUR, LABBENZ, AMPHETMU, THCU, LABBARB  Alcohol Level     Component Value Date/Time   ETH <10 01/07/2020 2336    IMAGING past 24 hours No results found.  PHYSICAL EXAM Pleasant elderly Caucasian male not in distress. . Afebrile. Head is nontraumatic. Neck is supple without bruit.    Cardiac exam no murmur or gallop. Lungs are clear to auscultation. Distal pulses are well felt. Neurological Exam : Patient is very sleepy and can barely be aroused and only mumbles follows only simple midline commands.  No aphasia.  Right gaze preference but able to look to the left past midline.  Partial left visual field defect.  Blinks to threat on the right but not so on the left.  Mild left facial weakness.  Tongue midline.  Motor  system exam shows left hemiparesis grade 3/5 strength with mild left-sided drift.  Significant weakness of left grip and intrinsic hand muscles.  Normal strength on the right.  Mild diminished sensation on the left compared to the right with some left sensory inattention.  Tone is diminished on the left compared to the right.  Left plantar upgoing right downgoing.  Gait not tested. ASSESSMENT/PLAN [name] is a 86 y.o. male with history of prostate cancer s/p prostatectomy, DB, HTN, HLD, dementia presenting from [facility] with L sided weakness and slurred speech. Received IV tPA 4/12/20201 at 2350.   Right hemispheric subcortical infarct s/p IV TPA with some clinical improvement.  Suspect patient has dementia at baseline.  Code Stroke CT head No acute abnormality. Chronic Small vessel disease. ASPECTS 10.     CTA head & neck 70% proximal right ICA and 50% left ICA stenosis.  Mild vertebral origin stenosis.  MRI large right thalamic and subcortical infarct  2D Echo EF 55-60%. No source of embolus   LDL 108  HgbA1c 8.1  SCDs for VTE prophylaxis  No antithrombotic prior to admission, now on No antithrombotic as within 24h of tPA administration. Hx hematuria requiring hospitalization. See Care Everywhere.   Therapy recommendations:     SNF level.  Very long time [id number] the last admission the first 2 places disposition:  pending  (from [facility])  DNR - out of facility order accompanied pt  Hypertension  Home meds:  lisinopril 20 BP goal per post tPA protocol x 24h following tPA administration . Long-term BP goal normotensive  Hyperlipidemia  Home meds:  pravachol 20, resumed in hospital  LDL 108, goal < 70  Increase pravachol to 40  Continue statin at discharge  Diabetes type II   Found documentation of DB hx, but not on meds:    HgbA1c pending, goal < 7.0  CBGs Recent Labs    01/11/20 0358 01/11/20 0804 01/11/20 1149  GLUCAP 122* 99 116*       SSI  Dysphagia . Secondary to stroke . NPO . Speech on board  Other Stroke Risk Factors  Advanced age  Obesity, Body mass index is 29.09 kg/m., recommend weight loss, diet and exercise as appropriate   Other Active Problems  Baseline dementia on aricept  Depression on effexor   Constipation on miralax  Urinary incontinence of mirabegron   Hx prostate cancer 2002 s/p prostatectomy 2006. Admitted for hematuria 07/2019 which resolved w/ tx, foley.  Hospital day # 3 Mobilize out of bed. Continue Therapy consults.  Regularize sleep-wake cycle.  Add Seroquel at night to help him sleep.  Aspirin for stroke prevention.  Given his baseline dementia which is likely going to be worse I do not think is a good long-term anticoagulation candidate hence we will not do prolonged cardiac monitoring.  I had a long discussion over the phone with patient's health power of attorney about goals of care.  She would like to watch him for the next few days to see if he is more awake during the day and able to swallow before sending him to nursing home for next week. . I have spent a total of  25  minutes with the patient reviewing hospital notes,  test results, labs and examining the patient as well as establishing an assessment and plan that was discussed personally with the patient.  > 50% of time was spent in direct patient care.       Antony Contras, MD  To contact Stroke Continuity provider, please refer to [email or web address] After hours, contact General Neurology

## 2020-01-12 ENCOUNTER — Inpatient Hospital Stay (HOSPITAL_COMMUNITY): Payer: Medicare Other

## 2020-01-12 DIAGNOSIS — E876 Hypokalemia: Secondary | ICD-10-CM

## 2020-01-12 DIAGNOSIS — E1159 Type 2 diabetes mellitus with other circulatory complications: Secondary | ICD-10-CM | POA: Diagnosis not present

## 2020-01-12 DIAGNOSIS — F0281 Dementia in other diseases classified elsewhere with behavioral disturbance: Secondary | ICD-10-CM

## 2020-01-12 DIAGNOSIS — G20 Parkinson's disease: Secondary | ICD-10-CM

## 2020-01-12 DIAGNOSIS — I6340 Cerebral infarction due to embolism of unspecified cerebral artery: Secondary | ICD-10-CM | POA: Diagnosis not present

## 2020-01-12 DIAGNOSIS — E7800 Pure hypercholesterolemia, unspecified: Secondary | ICD-10-CM

## 2020-01-12 DIAGNOSIS — I10 Essential (primary) hypertension: Secondary | ICD-10-CM

## 2020-01-12 DIAGNOSIS — I639 Cerebral infarction, unspecified: Secondary | ICD-10-CM | POA: Diagnosis not present

## 2020-01-12 LAB — CBC
HCT: 44.5 % (ref 39.0–52.0)
Hemoglobin: 14.5 g/dL (ref 13.0–17.0)
MCH: 28.4 pg (ref 26.0–34.0)
MCHC: 32.6 g/dL (ref 30.0–36.0)
MCV: 87.3 fL (ref 80.0–100.0)
Platelets: 187 10*3/uL (ref 150–400)
RBC: 5.1 MIL/uL (ref 4.22–5.81)
RDW: 14.5 % (ref 11.5–15.5)
WBC: 6.8 10*3/uL (ref 4.0–10.5)
nRBC: 0 % (ref 0.0–0.2)

## 2020-01-12 LAB — GLUCOSE, CAPILLARY
Glucose-Capillary: 102 mg/dL — ABNORMAL HIGH (ref 70–99)
Glucose-Capillary: 107 mg/dL — ABNORMAL HIGH (ref 70–99)
Glucose-Capillary: 108 mg/dL — ABNORMAL HIGH (ref 70–99)
Glucose-Capillary: 120 mg/dL — ABNORMAL HIGH (ref 70–99)
Glucose-Capillary: 138 mg/dL — ABNORMAL HIGH (ref 70–99)
Glucose-Capillary: 147 mg/dL — ABNORMAL HIGH (ref 70–99)

## 2020-01-12 LAB — SARS CORONAVIRUS 2 (TAT 6-24 HRS): SARS Coronavirus 2: NEGATIVE

## 2020-01-12 LAB — BASIC METABOLIC PANEL
Anion gap: 11 (ref 5–15)
BUN: 23 mg/dL (ref 8–23)
CO2: 22 mmol/L (ref 22–32)
Calcium: 8.3 mg/dL — ABNORMAL LOW (ref 8.9–10.3)
Chloride: 103 mmol/L (ref 98–111)
Creatinine, Ser: 1.25 mg/dL — ABNORMAL HIGH (ref 0.61–1.24)
GFR calc Af Amer: >60 mL/min (ref >60)
GFR calc non Af Amer: 53 mL/min — ABNORMAL LOW (ref >60)
Glucose, Bld: 101 mg/dL — ABNORMAL HIGH (ref 70–99)
Potassium: 3.2 mmol/L — ABNORMAL LOW (ref 3.5–5.1)
Sodium: 136 mmol/L (ref 135–145)

## 2020-01-12 MED ORDER — POTASSIUM CHLORIDE 20 MEQ PO PACK
40.0000 meq | PACK | Freq: Two times a day (BID) | ORAL | Status: AC
  Administered 2020-01-12 (×2): 40 meq via ORAL
  Filled 2020-01-12 (×2): qty 2

## 2020-01-12 MED ORDER — PANTOPRAZOLE SODIUM 40 MG PO PACK
40.0000 mg | PACK | Freq: Every day | ORAL | Status: DC
  Filled 2020-01-12: qty 20

## 2020-01-12 MED ORDER — ASPIRIN 81 MG PO CHEW: 324.0000 mg | CHEWABLE_TABLET | Freq: Every day | ORAL | Status: AC

## 2020-01-12 MED ORDER — CLOPIDOGREL BISULFATE 75 MG PO TABS: 75.0000 mg | ORAL_TABLET | Freq: Every day | ORAL | 0 refills | Status: AC

## 2020-01-12 MED ORDER — RESOURCE THICKENUP CLEAR PO POWD: ORAL | 1 refills | Status: AC

## 2020-01-12 MED ORDER — PRAVASTATIN SODIUM 40 MG PO TABS: 40.0000 mg | ORAL_TABLET | Freq: Every day | ORAL | 1 refills | Status: AC

## 2020-01-12 MED ORDER — PANTOPRAZOLE SODIUM 40 MG PO TBEC: 40.0000 mg | DELAYED_RELEASE_TABLET | Freq: Every day | ORAL | Status: DC

## 2020-01-12 MED ORDER — ASPIRIN 81 MG PO CHEW
324.0000 mg | CHEWABLE_TABLET | Freq: Every day | ORAL | Status: DC
  Administered 2020-01-13: 324 mg via ORAL
  Filled 2020-01-12: qty 4

## 2020-01-12 MED ORDER — PANTOPRAZOLE SODIUM 40 MG PO PACK: 40.0000 mg | PACK | Freq: Every day | ORAL | Status: DC

## 2020-01-12 NOTE — Discharge Summary (Addendum)
Patient ID: Jeremiah Mejia   MRN: [id number]      DOB: 05-31-1933  Date of Admission: 01/07/2020 Date of Discharge: 01/13/2020  Attending Physician:  Marvel Plan, MD, Stroke MD Consultant(s):    None  Patient's PCP:  Nadara Eaton, MD  DISCHARGE DIAGNOSIS:  Right thalamus and MCA/ACA and ACA infarct s/p IV TPA, embolic pattern, etiology unclear, large vessel disease vs. cardioembolic source  Other diagnosis:  Hyperlipidemia - LDL 108  Baseline dementia - continue aricept and seroquel  Diabetes - HgbA1c 8.1  Urinary incontinence   Hx prostate cancer 2002 s/p prostatectomy 2006. Admitted for hematuria 07/2019 which resolved w/ tx, foley.  Hypokalemia  Acute on chronic kidney disease - stage 3a    proximal right ICA stenosis   Allergies as of 01/13/2020      Reactions   Pseudoephedrine Hcl       Medication List    TAKE these medications   aspirin 81 MG chewable tablet Chew 4 tablets (324 mg total) by mouth daily.   busPIRone 10 MG tablet Commonly known as: BUSPAR Take 10 mg by mouth 2 (two) times daily.   clopidogrel 75 MG tablet Commonly known as: PLAVIX Take 1 tablet (75 mg total) by mouth daily. Take for 3 months only   donepezil 10 MG tablet Commonly known as: ARICEPT Take 10 mg by mouth at bedtime.   food thickener Powd Commonly known as: THICK IT Liquids must be honey thick.   lisinopril-hydrochlorothiazide 20-12.5 MG tablet Commonly known as: ZESTORETIC Take 1 tablet by mouth daily.   mirabegron ER 50 MG Tb24 tablet Commonly known as: MYRBETRIQ Take 50 mg by mouth daily.   polyethylene glycol 17 g packet Commonly known as: MIRALAX / GLYCOLAX Take 17 g by mouth daily as needed for mild constipation or moderate constipation.   pravastatin 40 MG tablet Commonly known as: PRAVACHOL Take 1 tablet (40 mg total) by mouth daily at 6 PM. What changed:   medication strength  how much to take  when to take this   Resource ThickenUp Clear  Powd Use as needed to thicken liquids to honey thick consistency.   venlafaxine XR 150 MG 24 hr capsule Commonly known as: EFFEXOR-XR Take 150 mg by mouth daily with breakfast.       HOME MEDICATIONS PRIOR TO ADMISSION Medications Prior to Admission  Medication Sig Dispense Refill  . busPIRone (BUSPAR) 10 MG tablet Take 10 mg by mouth 2 (two) times daily.    Marland Kitchen donepezil (ARICEPT) 10 MG tablet Take 10 mg by mouth at bedtime.    Marland Kitchen lisinopril-hydrochlorothiazide (ZESTORETIC) 20-12.5 MG tablet Take 1 tablet by mouth daily.    . mirabegron ER (MYRBETRIQ) 50 MG TB24 tablet Take 50 mg by mouth daily.    . polyethylene glycol (MIRALAX / GLYCOLAX) 17 g packet Take 17 g by mouth daily as needed for mild constipation or moderate constipation.    . pravastatin (PRAVACHOL) 20 MG tablet Take 20 mg by mouth daily.    Marland Kitchen venlafaxine XR (EFFEXOR-XR) 150 MG 24 hr capsule Take 150 mg by mouth daily with breakfast.       HOSPITAL MEDICATIONS . aspirin  324 mg Oral Daily  . busPIRone  10 mg Oral BID  . chlorhexidine  15 mL Mouth Rinse BID  . clopidogrel  75 mg Oral Daily  . donepezil  10 mg Oral QHS  . enoxaparin (LOVENOX) injection  40 mg Subcutaneous Q24H  . insulin aspart  0-15 Units Subcutaneous Q4H  . lisinopril  20 mg Oral Daily  . mouth rinse  15 mL Mouth Rinse q12n4p  . mirabegron ER  50 mg Oral Daily  . pantoprazole sodium  40 mg Oral QHS  . pravastatin  40 mg Oral q1800  . QUEtiapine  25 mg Oral QHS  . venlafaxine XR  150 mg Oral Q breakfast    LABORATORY STUDIES CBC    Component Value Date/Time   WBC 5.5 01/13/2020 0404   RBC 5.03 01/13/2020 0404   HGB 14.4 01/13/2020 0404   HCT 43.7 01/13/2020 0404   PLT 187 01/13/2020 0404   MCV 86.9 01/13/2020 0404   MCH 28.6 01/13/2020 0404   MCHC 33.0 01/13/2020 0404   RDW 14.6 01/13/2020 0404   LYMPHSABS 1.8 01/07/2020 2336   MONOABS 0.6 01/07/2020 2336   EOSABS 0.2 01/07/2020 2336   BASOSABS 0.0 01/07/2020 2336   CMP     Component Value Date/Time   NA 138 01/13/2020 0404   K 3.6 01/13/2020 0404   CL 108 01/13/2020 0404   CO2 23 01/13/2020 0404   GLUCOSE 156 (H) 01/13/2020 0404   BUN 15 01/13/2020 0404   CREATININE 0.96 01/13/2020 0404   CALCIUM 8.3 (L) 01/13/2020 0404   PROT 6.3 (L) 01/07/2020 2336   ALBUMIN 3.3 (L) 01/07/2020 2336   AST 18 01/07/2020 2336   ALT 21 01/07/2020 2336   ALKPHOS 66 01/07/2020 2336   BILITOT 0.7 01/07/2020 2336   GFRNONAA >60 01/13/2020 0404   GFRAA >60 01/13/2020 0404   COAGS Lab Results  Component Value Date   INR 1.1 01/07/2020   Lipid Panel    Component Value Date/Time   CHOL 190 01/08/2020 0346   TRIG 111 01/08/2020 0346   HDL 60 01/08/2020 0346   CHOLHDL 3.2 01/08/2020 0346   VLDL 22 01/08/2020 0346   LDLCALC 108 (H) 01/08/2020 0346   HgbA1C  Lab Results  Component Value Date   HGBA1C 8.1 (H) 01/08/2020   Urinalysis No results found for: COLORURINE, APPEARANCEUR, LABSPEC, PHURINE, GLUCOSEU, HGBUR, BILIRUBINUR, KETONESUR, PROTEINUR, UROBILINOGEN, NITRITE, LEUKOCYTESUR Urine Drug Screen No results found for: LABOPIA, COCAINSCRNUR, LABBENZ, AMPHETMU, THCU, LABBARB  Alcohol Level    Component Value Date/Time   [facility] <10 01/07/2020 2336     SIGNIFICANT DIAGNOSTIC STUDIES  CT ANGIO HEAD W OR WO CONTRAST CT ANGIO NECK W OR WO CONTRAST 01/08/2020 IMPRESSION:  1. Interpretation this exam was delayed by approximately 19 hours due to delayed exam completion by the technologist.  2. 70% diameter stenosis proximal right internal carotid artery.  3. 50% diameter stenosis proximal left internal carotid artery  4. Both vertebral arteries patent to the basilar. Mild stenosis in the proximal and distal vertebral arteries bilaterally.  5. Mild intracranial atherosclerotic disease without large vessel occlusion.  6. Right thyroid mass. Thyroid ultrasound recommended.   CT HEAD CODE STROKE WO CONTRAST 01/07/2020 IMPRESSION:  1. Chronic small vessel disease  without acute intracranial abnormality.  2. ASPECTS is 10.   CT HEAD WO CONTRAST 01/08/2020 IMPRESSION:  Area of hypoattenuation along the anterior margin of the right thalamus has increased since the prior study, likely an evolving infarct. No acute hemorrhage.   MR BRAIN WO CONTRAST 01/09/2020 IMPRESSION:  1. Acute infarct at the ventral aspect of the right thalamus, in close proximity to the posterior limb of the right internal capsule. This could cause left-sided weakness.  2. Additional punctate foci of acute ischemia within the superomedial right hemisphere.  3. No hemorrhage or mass effect.   DG CHEST PORT 1 VIEW 01/12/2020  IMPRESSION:  No active disease.   DG CHEST PORT 1 VIEW 01/08/2020 IMPRESSION:  Incompletely visualized left lung base. Otherwise, no acute airspace disease.   DG Swallowing Func-Speech Pathology  Result Date: 01/08/2020 Objective Swallowing Evaluation: Type of Study: MBS-Modified Barium Swallow Study  Patient Details Name: Kensley Lares MRN: [id number] Date of Birth: 1933-05-05 Today's Date: 01/08/2020 Time: SLP Start Time (ACUTE ONLY): 1320 -SLP Stop Time (ACUTE ONLY): 1346 SLP Time Calculation (min) (ACUTE ONLY): 26 min Past Medical History: No past medical history on file. Past Surgical History: No past surgical history on file. HPI: Wilman Tucker is an 86 y.o. male presenting from his [place] via EMS as a Code Stroke after he was found by staff to be weak on his left side. On arrival, he has right sided gaze deviation, left sided weakness and garbled/slurred speech. No history of stroke per staff conversation with EMS. Head CT (4/12) revealed chronic small vessel disease without acute intracranial abnormality. MRI to be completed.  Subjective: alert, cooperative Assessment / Plan / Recommendation CHL IP CLINICAL IMPRESSIONS 01/08/2020 Clinical Impression Patient presents with moderate oropharyngeal dysphagia. Oral phase is remarkable for prolonged  mastication and lingual residue. Pharyngeal phase is remarkable for reduced tongue base retraction and epiglottic inversion, leading to reduced laryngeal closure and a moderate amount of vallecular and pyriform sinus residue. The reduced laryngeal clousure and pharyngeal residue resulted in aspiration during and after the swallow with thin (PAS 7) and nectar thick (PAS 6 x1) liquids. The pt attempted to swallow mutliple times to reduce the residue, however it wasn't fully cleared. The pt's current mentation is fluctuating and required cues to maintain adequate alert state by the end of the study. The pt is not currently ready for a full diet, but may have snacks of purees and NTL throughout the day, as long as he is alert and following general aspiration precations. His meds may also be crushed in purees. Will continue to follow to determine readiness for a diet as mentation hopefully clears, and as is clinically appropriate. SLP Visit Diagnosis Dysphagia, oropharyngeal phase (R13.12) Attention and concentration deficit following -- Frontal lobe and executive function deficit following -- Impact on safety and function Moderate aspiration risk   CHL IP TREATMENT RECOMMENDATION 01/08/2020 Treatment Recommendations Therapy as outlined in treatment plan below   Prognosis 01/08/2020 Prognosis for Safe Diet Advancement Fair Barriers to Reach Goals Cognitive deficits Barriers/Prognosis Comment -- CHL IP DIET RECOMMENDATION 01/08/2020 SLP Diet Recommendations Other (Comment) Liquid Administration via Straw Medication Administration Crushed with puree Compensations Minimize environmental distractions;Slow rate;Small sips/bites Postural Changes Seated upright at 90 degrees   CHL IP OTHER RECOMMENDATIONS 01/08/2020 Recommended Consults -- Oral Care Recommendations Oral care before and after PO Other Recommendations Prohibited food (jello, ice cream, thin soups)   CHL IP FOLLOW UP RECOMMENDATIONS 01/08/2020 Follow up Recommendations  Skilled Nursing facility   [facility] IP FREQUENCY AND DURATION 01/08/2020 Speech Therapy Frequency (ACUTE ONLY) min 2x/week Treatment Duration 2 weeks      CHL IP ORAL PHASE 01/08/2020 Oral Phase Impaired Oral - Pudding Teaspoon -- Oral - Pudding Cup -- Oral - Honey Teaspoon -- Oral - Honey Cup -- Oral - Nectar Teaspoon -- Oral - Nectar Cup -- Oral - Nectar Straw Lingual/palatal residue Oral - Thin Teaspoon -- Oral - Thin Cup -- Oral - Thin Straw Lingual/palatal residue Oral - Puree Lingual/palatal residue Oral - Mech Soft --  Oral - Regular Lingual/palatal residue;Impaired mastication Oral - Multi-Consistency -- Oral - Pill -- Oral Phase - Comment --  CHL IP PHARYNGEAL PHASE 01/08/2020 Pharyngeal Phase Impaired Pharyngeal- Pudding Teaspoon -- Pharyngeal -- Pharyngeal- Pudding Cup -- Pharyngeal -- Pharyngeal- Honey Teaspoon -- Pharyngeal -- Pharyngeal- Honey Cup -- Pharyngeal -- Pharyngeal- Nectar Teaspoon -- Pharyngeal -- Pharyngeal- Nectar Cup -- Pharyngeal -- Pharyngeal- Nectar Straw Penetration/Aspiration during swallow;Penetration/Apiration after swallow;Pharyngeal residue - valleculae;Pharyngeal residue - pyriform;Trace aspiration;Reduced tongue base retraction;Reduced airway/laryngeal closure Pharyngeal Material enters airway, passes BELOW cords then ejected out Pharyngeal- Thin Teaspoon -- Pharyngeal -- Pharyngeal- Thin Cup -- Pharyngeal -- Pharyngeal- Thin Straw Reduced airway/laryngeal closure;Penetration/Aspiration during swallow;Penetration/Apiration after swallow;Trace aspiration;Pharyngeal residue - valleculae;Pharyngeal residue - pyriform Pharyngeal Material enters airway, passes BELOW cords and not ejected out despite cough attempt by patient Pharyngeal- Puree Pharyngeal residue - valleculae;Pharyngeal residue - pyriform;Reduced tongue base retraction Pharyngeal -- Pharyngeal- Mechanical Soft -- Pharyngeal -- Pharyngeal- Regular Pharyngeal residue - valleculae;Pharyngeal residue - pyriform Pharyngeal --  Pharyngeal- Multi-consistency -- Pharyngeal -- Pharyngeal- Pill -- Pharyngeal -- Pharyngeal Comment --  CHL IP CERVICAL ESOPHAGEAL PHASE 01/08/2020 Cervical Esophageal Phase WFL Pudding Teaspoon -- Pudding Cup -- Honey Teaspoon -- Honey Cup -- Nectar Teaspoon -- Nectar Cup -- Nectar Straw -- Thin Teaspoon -- Thin Cup -- Thin Straw -- Puree -- Mechanical Soft -- Regular -- Multi-consistency -- Pill -- Cervical Esophageal Comment -- Mahala Menghini., M.A. CCC-SLP Acute Rehabilitation Services Pager [phone] Office [phone] 01/08/2020, 2:42 PM              ECHOCARDIOGRAM COMPLETE  01/08/2020 IMPRESSIONS   1. Left ventricular ejection fraction, by estimation, is 55 to 60%. The left ventricle has normal function. The left ventricle has no regional wall motion abnormalities. There is mild concentric left ventricular hypertrophy. Left ventricular diastolic parameters are consistent with Grade I diastolic dysfunction (impaired relaxation). Elevated left atrial pressure.   2. Right ventricular systolic function is normal. The right ventricular size is normal. Tricuspid regurgitation signal is inadequate for assessing PA pressure.   3. The mitral valve is normal in structure. No evidence of mitral valve regurgitation.   4. The aortic valve is normal in structure. Aortic valve regurgitation is not visualized.   5. Aortic dilatation noted. There is borderline dilatation of the ascending aorta measuring 38 mm.     HISTORY OF PRESENT ILLNESS (From Dr Shelbie Hutching H&P 01/08/2020) Willem Klingensmith is an 86 y.o. male presenting from his Assisted Living Facility via EMS as a Code Stroke after he was found by staff to be weak on his left side at 2245. LKN was 2200. On arrival, he has right sided gaze deviation, left sided weakness and garbled/slurred speech. No history of stroke per staff conversation with EMS.  CBG taken by EMS was 167. O2 Sat 97% on RA. BP 170/100. HR 60.  Per telephone call to his friend Jairo Ben,  [phone]), who is his healthcare power of attorney, the patient is not taking a blood thinner, and has not had a recent operation, head trauma, MI or stroke.  LSN: [id number] tPA Given: Yes.  NIHSS: 15  HOSPITAL COURSE Mr. Kell Ferris is an 86 y.o. male with history of prostate cancer s/p prostatectomy, DB, HTN, HLD, and dementia presenting from [facility] with L sided weakness and slurred speech. He received IV tPA 4/12/20201 at 2350.  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 cardioembolic source  CT head No acute  abnormality. Chronic Small vessel disease. ASPECTS 10.     CTA head & neck 70% proximal right ICA and 50% left ICA stenosis.  Mild vertebral origin stenosis.  Right PCA origin high grade stenosis  MRI - Acute infarct at the ventral aspect of the right thalamus, in close proximity to the posterior limb of the right internal capsule. This could cause left-sided weakness. Additional punctate foci of acute ischemia within the superomedial right hemisphere.  2D Echo EF 55-60%. No source of embolus   As per Dr. Pearlean Brownie "Given his baseline dementia which is likely going to be worse I do not think he is a good long-term anticoagulation candidate hence we will not do prolonged cardiac monitoring."  LDL 108  HgbA1c 8.1  lovenox for VTE prophylaxis  No antithrombotic prior to admission. Hx hematuria requiring hospitalization. Now on ASA 325 mg QD and Plavix 75 mg QD for 3 months and then ASA alone due to multivessel high grade stenosis.  Therapy recommendations:  Back to SNF  (from [facility])  Deposition - COVID test neg -> back to [facility]  DNR - out of facility order accompanied pt  Hypertension  Home meds:  lisinopril 20  On lisinopril 20  BP stable  Long-term BP goal normotensive  Hyperlipidemia  Home meds:  pravachol 20, resumed in hospital  LDL 108, goal < 70  Increased pravachol  to 40  Continue statin at discharge  Diabetes type II   Found documentation of DB hx, but not on meds:    HgbA1c 8.1, goal < 7.0  CBGs  SSI  PCP follow up  Dysphagia  Secondary to stroke  Diet - Dysphagia 1 with honey thick liquids  Speech on board  Other Stroke Risk Factors  Advanced age  Obesity, Body mass index is 29.09 kg/m., recommend weight loss, diet and exercise as appropriate   Other Active Problems  Baseline dementia on aricept - add seroquel for sundowning  Depression on effexor   Constipation on miralax  Urinary incontinence of mirabegron   Hx prostate cancer 2002 s/p prostatectomy 2006. Admitted for hematuria 07/2019 which resolved w/ tx, foley.  Hypokalemia - 3.2 - supplement - recheck in AM  Acute on chronic kidney disease - stage 3a - creatinine - 1.17 -> 1.2 -> 1.25 (on IVF @ 40cc)  DNR  Right thyroid mass. Thyroid ultrasound recommended as outpt    DISCHARGE EXAM Vitals:   01/12/20 1941 01/12/20 2346 01/13/20 0326 01/13/20 0816  BP: (!) 167/83 (!) 145/76 (!) 167/66 (!) 174/68  Pulse: 71 60 61 (!) 51  Resp: 17 18 17 20   Temp: 97.8 F (36.6 C) 98.2 F (36.8 C) 97.7 F (36.5 C) 97.7 F (36.5 C)  TempSrc: Oral Oral Oral Oral  SpO2: 98% 97% 96% 95%  Weight:      Height:       General- Well nourished, well developed, in no apparent distress.  Ophthalmologic- fundi not visualized due to noncooperation.  Cardiovascular - Regular rhythm and rate.  Neuro- Level of arousal and orientation tomonth, place, and person were intact, but not to year. Languageexam showed spontaneous speech, although slow, no aphasia, able to name and repeat simple sentences. Moderate dysarthria.Visual field full, blinking to visual threat bilaterally. EOMI, left lower facial weakness. Tongue midline. LUE3/5 strength with mild left-sided drift.RUE at least 4/5. LLE 3-/5 and RLE 3+/5.Mild diminished sensation on the left compared to the right  with some left sensory inattention. Tone is diminished on the left compared to  the right.Coordination intact on the right but mild dysmetria on the left but not out of proportion of weakness. Gait not tested.   DISCHARGE DIET   Diet Order            DIET - DYS 1 Room service appropriate? Yes; Fluid consistency: Honey Thick  Diet effective now             liquids  DISCHARGE PLAN  Disposition:  Discharge to SNF.  Secondary stroke prevention - aspirin 325 mg daily along with Plavix 75 mg daily for 3 months. Then aspirin 325 mg daily alone.  Ongoing risk factor control by Primary Care Physician at time of discharge  Follow-up Nadara EatonPiazza, Michael J, MD in 2 weeks. Pt needs outpatient thyroid ultrasound to evaluate thyroid mass.   Follow-up in [facility] in 4 weeks, office to schedule an appointment.   40 minutes were spent preparing discharge.  Marvel PlanJindong Yahya Boldman, MD PhD Stroke Neurology 01/13/2020 12:41 PM

## 2020-01-12 NOTE — Progress Notes (Signed)
  Speech Language Pathology Treatment: Dysphagia  Patient Details Name: Jeremiah Mejia MRN: [id number] DOB: 11-18-1932 Today's Date: 01/12/2020 Time: 8416-6063 SLP Time Calculation (min) (ACUTE ONLY): 13 min  Assessment / Plan / Recommendation Clinical Impression  Pt was seen for additional diagnostic PO trials. Per SW note, pt's POA is hoping for him to d/c to SNF today to continue SLP f/u there. Pt is more alert today compared to previous date although still drowsy. He is asking for POs though. He continues to cough intermittently with nectar thick liquids even when offered via small spoonfuls. He ate 4 oz of applesauce and almost 4 full oz of honey thick liquids (via cup, straw, spoon) with no overt s/s of aspiration. Multiple swallows appear to be consistent with MBS results when pt had pharyngeal residue. Although honey thick liquids were not directly tested on MBS, whenever the pt did aspirate it was sensed via cough, even if the cough was ineffective at clearing the airway. This would likely suggest improved airway protection with honey thick liquids over nectar thick liquids given current clinical presentation. Recommend starting Dys 1 diet and honey thick liquids with careful assistance during intake for use of swallowing precautions.    HPI HPI: Jeremiah Mejia is an 86 y.o. male presenting from his Assisted Living Facility via EMS as a Code Stroke after he was found by staff to be weak on his left side. On arrival, he has right sided gaze deviation, left sided weakness and garbled/slurred speech. No history of stroke per staff conversation with EMS. Head CT (4/12) revealed chronic small vessel disease without acute intracranial abnormality. MRI to be completed.      SLP Plan  Continue with current plan of care       Recommendations  Diet recommendations: Dysphagia 1 (puree);Honey-thick liquid Liquids provided via: Straw;Cup;Teaspoon Medication Administration: Crushed with  puree Supervision: Full supervision/cueing for compensatory strategies Compensations: Minimize environmental distractions;Slow rate;Small sips/bites Postural Changes and/or Swallow Maneuvers: Seated upright 90 degrees;Upright 30-60 min after meal                Oral Care Recommendations: Oral care BID Follow up Recommendations: Skilled Nursing facility SLP Visit Diagnosis: Dysphagia, oropharyngeal phase (R13.12) Plan: Continue with current plan of care       GO                 Mahala Menghini., M.A. CCC-SLP Acute Rehabilitation Services Pager [phone] Office [phone]  01/12/2020, 10:56 AM

## 2020-01-12 NOTE — Progress Notes (Signed)
STROKE TEAM PROGRESS NOTE   INTERVAL HISTORY Son and POA at bedside.  Patient lying in bed, comfortably, awake alert, interactive, no agitation.  Pending SNF discharge.  Covid testing sent.  Vitals:   01/11/20 2337 01/12/20 0343 01/12/20 0750 01/12/20 1122  BP: 126/71 117/77 (!) 152/69 117/79  Pulse: 62 65 61 67  Resp: 16 17 18 18   Temp: (!) 97.5 F (36.4 C) (!) 97.5 F (36.4 C) 98 F (36.7 C) 97.6 F (36.4 C)  TempSrc: Oral Oral Oral Oral  SpO2: 96% 94% 100% 100%  Weight:      Height:        CBC:  Recent Labs  Lab 01/07/20 2336 01/07/20 2336 01/07/20 2348 01/12/20 0510  WBC 7.7  --   --  6.8  NEUTROABS 5.1  --   --   --   HGB 14.8   < > 15.0 14.5  HCT 46.2   < > 44.0 44.5  MCV 88.7  --   --  87.3  PLT 201  --   --  187   < > = values in this interval not displayed.    Basic Metabolic Panel:  Recent Labs  Lab 01/07/20 2336 01/07/20 2336 01/07/20 2348 01/12/20 0510  NA 138   < > 138 136  K 3.8   < > 3.5 3.2*  CL 103   < > 100 103  CO2 27  --   --  22  GLUCOSE 188*   < > 185* 101*  BUN 19   < > 21 23  CREATININE 1.17   < > 1.20 1.25*  CALCIUM 9.0  --   --  8.3*   < > = values in this interval not displayed.   Lipid Panel:     Component Value Date/Time   CHOL 190 01/08/2020 0346   TRIG 111 01/08/2020 0346   HDL 60 01/08/2020 0346   CHOLHDL 3.2 01/08/2020 0346   VLDL 22 01/08/2020 0346   LDLCALC 108 (H) 01/08/2020 0346   HgbA1c:  Lab Results  Component Value Date   HGBA1C 8.1 (H) 01/08/2020   Urine Drug Screen: No results found for: LABOPIA, COCAINSCRNUR, LABBENZ, AMPHETMU, THCU, LABBARB  Alcohol Level     Component Value Date/Time   [facility] <10 01/07/2020 2336    SIGNIFICANT DIAGNOSTIC STUDIES  CT ANGIO HEAD W OR WO CONTRAST CT ANGIO NECK W OR WO CONTRAST 01/08/2020 IMPRESSION:  1. Interpretation this exam was delayed by approximately 19 hours due to delayed exam completion by the technologist.  2. 70% diameter stenosis proximal right  internal carotid artery.  3. 50% diameter stenosis proximal left internal carotid artery  4. Both vertebral arteries patent to the basilar. Mild stenosis in the proximal and distal vertebral arteries bilaterally.  5. Mild intracranial atherosclerotic disease without large vessel occlusion.  6. Right thyroid mass. Thyroid ultrasound recommended.   CT HEAD CODE STROKE WO CONTRAST 01/07/2020 IMPRESSION:  1. Chronic small vessel disease without acute intracranial abnormality.  2. ASPECTS is 10.   CT HEAD WO CONTRAST 01/08/2020 IMPRESSION:  Area of hypoattenuation along the anterior margin of the right thalamus has increased since the prior study, likely an evolving infarct. No acute hemorrhage.   MR BRAIN WO CONTRAST 01/09/2020 IMPRESSION:  1. Acute infarct at the ventral aspect of the right thalamus, in close proximity to the posterior limb of the right internal capsule. This could cause left-sided weakness.  2. Additional punctate foci of acute ischemia within the superomedial  right hemisphere.  3. No hemorrhage or mass effect.   DG CHEST PORT 1 VIEW 01/12/2020  IMPRESSION:  No active disease.   DG CHEST PORT 1 VIEW 01/08/2020 IMPRESSION:  Incompletely visualized left lung base. Otherwise, no acute airspace disease.            ECHOCARDIOGRAM COMPLETE  01/08/2020 IMPRESSIONS   1. Left ventricular ejection fraction, by estimation, is 55 to 60%. The left ventricle has normal function. The left ventricle has no regional wall motion abnormalities. There is mild concentric left ventricular hypertrophy. Left ventricular diastolic parameters are consistent with Grade I diastolic dysfunction (impaired relaxation). Elevated left atrial pressure.   2. Right ventricular systolic function is normal. The right ventricular size is normal. Tricuspid regurgitation signal is inadequate for assessing PA pressure.   3. The mitral valve is normal in structure. No evidence of mitral valve regurgitation.    4. The aortic valve is normal in structure. Aortic valve regurgitation is not visualized.   5. Aortic dilatation noted. There is borderline dilatation of the ascending aorta measuring 38 mm.    PHYSICAL EXAM  Temp:  [97.5 F (36.4 C)-98.4 F (36.9 C)] 97.8 F (36.6 C) (04/17 1941) Pulse Rate:  [61-74] 71 (04/17 1941) Resp:  [16-20] 17 (04/17 1941) BP: (117-167)/(69-86) 167/83 (04/17 1941) SpO2:  [94 %-100 %] 98 % (04/17 1941)  General - Well nourished, well developed, in no apparent distress.  Ophthalmologic - fundi not visualized due to noncooperation.  Cardiovascular - Regular rhythm and rate.  Neuro - Level of arousal and orientation to month, place, and person were intact, but not to year. Language exam showed spontaneous speech, although slow, no aphasia, able to name and repeat simple sentences. Moderate dysarthria. Visual field full, blinking to visual threat bilaterally. EOMI, left lower facial weakness. Tongue midline. LUE 3/5 strength with mild left-sided drift. RUE at least 4/5. LLE 3-/5 and RLE 3+/5. Mild diminished sensation on the left compared to the right with some left sensory inattention. Tone is diminished on the left compared to the right. Coordination intact on the right but mild dysmetria on the left but not out of proportion of weakness. Gait not tested.    ASSESSMENT/PLAN Mr. Thompson Mckim is a 86 y.o. male with history of prostate cancer s/p prostatectomy, DB, HTN, HLD, dementia presenting from [facility] with L sided weakness and slurred speech. Received IV tPA 4/12/20201 at 2350.  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 cardioembolic source  CT head No acute abnormality. Chronic Small vessel disease. ASPECTS 10.     CTA head & neck 70% proximal right ICA and 50% left ICA stenosis.  Mild vertebral origin stenosis.  Right PCA origin high grade stenosis  MRI - Acute infarct at  the ventral aspect of the right thalamus, in close proximity to the posterior limb of the right internal capsule. This could cause left-sided weakness. Additional punctate foci of acute ischemia within the superomedial right hemisphere.  2D Echo EF 55-60%. No source of embolus   As per Dr. Pearlean Brownie "Given his baseline dementia which is likely going to be worse I do not think he is a good long-term anticoagulation candidate hence we will not do prolonged cardiac monitoring."  LDL 108  HgbA1c 8.1  lovenox for VTE prophylaxis  No antithrombotic prior to admission. Hx hematuria requiring hospitalization. Now on ASA 325 mg QD and Plavix 75 mg QD for 3 months and then  ASA alone due to multivessel high grade stenosis.  Therapy recommendations:  Back to SNF  (from [facility])  Deposition - pending COVID test  DNR - out of facility order accompanied pt  Hypertension  Home meds:  lisinopril 20  On lisinopril 20  BP stable . Long-term BP goal normotensive  Hyperlipidemia  Home meds:  pravachol 20, resumed in hospital  LDL 108, goal < 70  Increased pravachol to 40  Continue statin at discharge  Diabetes type II   Found documentation of DB hx, but not on meds:    HgbA1c 8.1, goal < 7.0  CBGs  SSI  PCP follow up  Dysphagia . Secondary to stroke . Diet - Dysphagia 1 with honey thick liquids . Speech on board  Other Stroke Risk Factors  Advanced age  Obesity, Body mass index is 29.09 kg/m., recommend weight loss, diet and exercise as appropriate   Other Active Problems  Baseline dementia on aricept - add seroquel for sundowning  Depression on effexor   Constipation on miralax  Urinary incontinence of mirabegron   Hx prostate cancer 2002 s/p prostatectomy 2006. Admitted for hematuria 07/2019 which resolved w/ tx, foley.  Hypokalemia - 3.2 - supplement - recheck in AM  Acute on chronic kidney disease - stage 3a - creatinine - 1.17 -> 1.2 -> 1.25 (on IVF @  40cc)  DNR  Right thyroid mass. Thyroid ultrasound recommended as outpt  Hospital day # 4  Marvel Plan, MD PhD Stroke Neurology 01/12/2020 8:41 PM   To contact Stroke Continuity provider, please refer to [email or web address]. After hours, contact General Neurology

## 2020-01-12 NOTE — Progress Notes (Signed)
PHARMACIST - PHYSICIAN COMMUNICATION   CONCERNING: IV to Oral Route Change Policy  RECOMMENDATION: This patient is receiving protonix by the intravenous route.  Based on criteria approved by the Pharmacy and Therapeutics Committee, the intravenous medication(s) is/are being converted to the equivalent oral dose form(s).   DESCRIPTION: These criteria include:  The patient is eating (either orally or via tube) and/or has been taking other orally administered medications for a least 24 hours  The patient has no evidence of active gastrointestinal bleeding or impaired GI absorption (gastrectomy, short bowel, patient on TNA or NPO).  If you have questions about this conversion, please contact the Pharmacy Department  []  ( [phone] )  [facility] []  ( [phone] )  [facility] [x]  ( [phone] )  [facility] []  ( [phone] )  [facility] []  ( [phone] )  [facility]    Rickard Kennerly, PharmD Clinical Pharmacist **Pharmacist phone directory can now be found on [email or web address] ([email or web address]).  Listed under [facility].     

## 2020-01-12 NOTE — TOC Progression Note (Signed)
Transition of Care [facility]) - Progression Note    Patient Details  Name: Jeremiah Mejia MRN: [id number] Date of Birth: 1932-10-17  Transition of Care [facility]) CM/SW Contact  Nonda Lou, [place] Phone Number: 01/12/2020, 1:50 PM  Clinical Narrative:    CSW verified with [facility] that a covid is needed prior to patient discharge back to facility. Confirmed patient discharge may be delayed to Sunday. CSW provided MD with the update. Patient discharge ready once covid results received. CSW will continue to follow and assist with discharge planning needs.    Expected Discharge Plan: Skilled Nursing Facility Barriers to Discharge: Continued Medical Work up  Expected Discharge Plan and Services Expected Discharge Plan: Skilled Nursing Facility     Post Acute Care Choice: Skilled Nursing Facility Living arrangements for the past 2 months: Assisted Living Facility                                       Social Determinants of Health (SDOH) Interventions    Readmission Risk Interventions No flowsheet data found.

## 2020-01-13 DIAGNOSIS — E876 Hypokalemia: Secondary | ICD-10-CM | POA: Diagnosis not present

## 2020-01-13 DIAGNOSIS — I639 Cerebral infarction, unspecified: Secondary | ICD-10-CM | POA: Diagnosis not present

## 2020-01-13 DIAGNOSIS — I6340 Cerebral infarction due to embolism of unspecified cerebral artery: Secondary | ICD-10-CM | POA: Diagnosis not present

## 2020-01-13 DIAGNOSIS — I10 Essential (primary) hypertension: Secondary | ICD-10-CM | POA: Diagnosis not present

## 2020-01-13 LAB — BASIC METABOLIC PANEL
Anion gap: 7 (ref 5–15)
BUN: 15 mg/dL (ref 8–23)
CO2: 23 mmol/L (ref 22–32)
Calcium: 8.3 mg/dL — ABNORMAL LOW (ref 8.9–10.3)
Chloride: 108 mmol/L (ref 98–111)
Creatinine, Ser: 0.96 mg/dL (ref 0.61–1.24)
GFR calc Af Amer: >60 mL/min (ref >60)
GFR calc non Af Amer: >60 mL/min (ref >60)
Glucose, Bld: 156 mg/dL — ABNORMAL HIGH (ref 70–99)
Potassium: 3.6 mmol/L (ref 3.5–5.1)
Sodium: 138 mmol/L (ref 135–145)

## 2020-01-13 LAB — CBC
HCT: 43.7 % (ref 39.0–52.0)
Hemoglobin: 14.4 g/dL (ref 13.0–17.0)
MCH: 28.6 pg (ref 26.0–34.0)
MCHC: 33 g/dL (ref 30.0–36.0)
MCV: 86.9 fL (ref 80.0–100.0)
Platelets: 187 10*3/uL (ref 150–400)
RBC: 5.03 MIL/uL (ref 4.22–5.81)
RDW: 14.6 % (ref 11.5–15.5)
WBC: 5.5 10*3/uL (ref 4.0–10.5)
nRBC: 0 % (ref 0.0–0.2)

## 2020-01-13 LAB — GLUCOSE, CAPILLARY
Glucose-Capillary: 155 mg/dL — ABNORMAL HIGH (ref 70–99)
Glucose-Capillary: 95 mg/dL (ref 70–99)
Glucose-Capillary: 127 mg/dL — ABNORMAL HIGH (ref 70–99)

## 2020-01-13 MED ORDER — STARCH (THICKENING) PO POWD: ORAL | 1 refills | Status: AC

## 2020-01-13 MED ORDER — FLEET ENEMA 7-19 GM/118ML RE ENEM
1.0000 | ENEMA | Freq: Once | RECTAL | Status: AC
  Administered 2020-01-13: 1 via RECTAL
  Filled 2020-01-13: qty 1

## 2020-01-13 MED ORDER — STARCH (THICKENING) PO POWD
ORAL | Status: DC | PRN
  Filled 2020-01-13: qty 227

## 2020-01-13 NOTE — Progress Notes (Signed)
Pt discharge to [facility] rehab and report called off to nurse Beth at the facility. Pt cleaned, IV and telemetry removed prior to discharge. Pt to transported off to disposition by [facility]. Pt transported off unit via stretcher with belongings to the side. Dionne Bucy RN

## 2020-01-13 NOTE — TOC Transition Note (Signed)
Transition of Care [facility]) - CM/SW Discharge Note   Patient Details  Name: Jeremiah Mejia MRN: [id number] Date of Birth: 02/03/1933  Transition of Care [facility]) CM/SW Contact:  Luiz Blare Phone Number: 01/13/2020, 12:21 PM   Clinical Narrative:    Patient will DC to: [facility] Anticipated DC date: 01/13/2020 Family notified: Arline Asp  Transport by: Sharin Mons   Per MD patient ready for DC to [facility]. RN, patient, patient's family, and facility notified of DC. Discharge Summary and FL2 sent to facility. RN to call report prior to discharge [phone] Room 7005). DC packet on chart. Ambulance transport requested for patient.   CSW will sign off for now as social work intervention is no longer needed. Please consult [place] again if new needs arise.    Final next level of care: Skilled Nursing Facility Barriers to Discharge: No Barriers Identified   Patient Goals and CMS Choice Patient states their goals for this hospitalization and ongoing recovery are:: patient unable to participate in goal setting due to disorientation CMS Medicare.gov Compare Post Acute Care list provided to:: Patient Represenative (must comment)(Cindy) Choice offered to / list presented to : [facility] POA / Guardian  Discharge Placement              Patient chooses bed at: [facility] Patient to be transferred to facility by: [facility] Name of family member notified: Arline Asp [facility] POA Patient and family notified of of transfer: 01/13/20  Discharge Plan and Services     Post Acute Care Choice: Skilled Nursing Facility                               Social Determinants of Health (SDOH) Interventions     Readmission Risk Interventions No flowsheet data found.

## 2020-01-14 NOTE — Progress Notes (Signed)
Late entry for missed Modified Rankin Score.  Scoring based on review of the medical record.    01/11/20 1600  Modified Rankin (Stroke Patients Only)  Pre-Morbid Rankin Score 3  Modified Rankin 5   Lavona Mound, [place]   Acute Rehabilitation Services  Pager [phone] Office [phone] 01/14/2020

## 2020-03-11 ENCOUNTER — Ambulatory Visit (INDEPENDENT_AMBULATORY_CARE_PROVIDER_SITE_OTHER): Payer: Medicare Other | Admitting: Neurology

## 2020-03-11 VITALS — BP 124/66 | HR 60

## 2020-03-11 DIAGNOSIS — I639 Cerebral infarction, unspecified: Secondary | ICD-10-CM | POA: Diagnosis not present

## 2020-03-11 DIAGNOSIS — I6521 Occlusion and stenosis of right carotid artery: Secondary | ICD-10-CM

## 2020-03-11 DIAGNOSIS — F0151 Vascular dementia with behavioral disturbance: Secondary | ICD-10-CM

## 2020-03-11 DIAGNOSIS — F01518 Vascular dementia, unspecified severity, with other behavioral disturbance: Secondary | ICD-10-CM

## 2020-03-11 DIAGNOSIS — I63411 Cerebral infarction due to embolism of right middle cerebral artery: Secondary | ICD-10-CM

## 2020-03-11 NOTE — Progress Notes (Signed)
[facility] [address] [place]. [place] [address] [phone]       OFFICE FOLLOW-UP NOTE  [name] Date of Birth:  [birth year] Medical Record Number:  [id number]   HPI: Jeremiah Mejia is a 86 year old Caucasian male seen today for first office follow-up visit following hospital admission for stroke.  History is obtained from the patient and review of electronic medical records and I personally reviewed imaging films in PACS.Jeremiah Hooveris an 86 y.o.malepresenting from his Assisted Living Facility via EMS as a Code Stroke after he was found by staff to be weak on his left side at 2245. LKN was 2200. On arrival, he has right sided gaze deviation, left sided weakness and garbled/slurred speech. No history of stroke per staff conversation withEMS. CBG taken by EMS was 167. O2 Sat 97% on RA. BP 170/100. HR 60. Per telephone call to his friend Jeremiah Mejia, [phone]), who is his healthcare power of attorney, the patient is not taking a blood thinner, and has not had a recent operation, head trauma, MI or stroke. LSN:2200 tPA Given:Yes. NIHSS:15 CT angiogram did not show any large vessel occlusion but did show 70% proximal right ICA and 50% left ICA stenosis with high-grade right PCA origin stenosis.  He was admitted to the intensive care unit and blood pressure was tightly controlled.  MRI scan of the brain showed acute infarct involving ventral aspect of the right thalamus in close proximity to the posterior limb of internal capsule as well as additional punctate foci of acute infarction involving superior medial right hemisphere.  2D echo showed normal ejection fraction.  LDL cholesterol is 108 mg percent and hemoglobin A1c was 8.1.  Patient not considered a candidate for carotid revascularization given his significant underlying baseline dementia.  He was started on aspirin Plavix for 3 months due to his multivessel high-grade stenosis and was transferred back to  [facility] burn assisted living facility where he was initially in rehab section but now he is in the memory care unit.  He has not progressed well and as per the caregiver from [facility] who is accompanying him today patient is not been able to get up and walk even with therapist and spends most of his time in a wheelchair.  He still has some left-sided weakness.  He is cognitively much worse and is disoriented most of the time.  He is able to communicate but speech is slightly nonfluent.  He is currently on aspirin tolerating it well without side effects. ROS:   14 system review of systems is positive for weakness, gait difficulty, memory loss, confusion, disorientation and all other systems negative PMH: No past medical history on file.  Social History:  Social History   Socioeconomic History  . Marital status: Single    Spouse name: Not on file  . Number of children: Not on file  . Years of education: Not on file  . Highest education level: Not on file  Occupational History  . Not on file  Tobacco Use  . Smoking status: Unknown If Ever Smoked  Substance and Sexual Activity  . Alcohol use: Not on file  . Drug use: Not on file  . Sexual activity: Not on file  Other Topics Concern  . Not on file  Social History Narrative  . Not on file   Social Determinants of Health   Financial Resource Strain:   . Difficulty of Paying Living Expenses:   Food Insecurity:   . Worried About Radiation protection practitioner  of Food in the Last Year:   . [place] in the Last Year:   Transportation Needs:   . Lack of Transportation (Medical):   Marland Kitchen Lack of Transportation (Non-Medical):   Physical Activity:   . Days of Exercise per Week:   . Minutes of Exercise per Session:   Stress:   . Feeling of Stress :   Social Connections:   . Frequency of Communication with Friends and Family:   . Frequency of Social Gatherings with Friends and Family:   . Attends Religious Services:   . Active Member of Clubs or  Organizations:   . Attends Archivist Meetings:   Marland Kitchen Marital Status:   Intimate Partner Violence:   . Fear of Current or Ex-Partner:   . Emotionally Abused:   Marland Kitchen Physically Abused:   . Sexually Abused:     Medications:   Current Outpatient Medications on File Prior to Visit  Medication Sig Dispense Refill  . aspirin 81 MG chewable tablet Chew 4 tablets (324 mg total) by mouth daily.    . busPIRone (BUSPAR) 10 MG tablet Take 10 mg by mouth 2 (two) times daily.    . clopidogrel (PLAVIX) 75 MG tablet Take 1 tablet (75 mg total) by mouth daily. Take for 3 months only 90 tablet 0  . donepezil (ARICEPT) 10 MG tablet Take 10 mg by mouth at bedtime.    . food thickener (THICK IT) POWD Liquids must be honey thick. 227 g 1  . lisinopril-hydrochlorothiazide (ZESTORETIC) 20-12.5 MG tablet Take 1 tablet by mouth daily.    . Maltodextrin-Xanthan Gum (RESOURCE THICKENUP CLEAR) POWD Use as needed to thicken liquids to honey thick consistency. 125 g 1  . melatonin 5 MG TABS Take 5 mg by mouth.    . mirabegron ER (MYRBETRIQ) 50 MG TB24 tablet Take 50 mg by mouth daily.    . polyethylene glycol (MIRALAX / GLYCOLAX) 17 g packet Take 17 g by mouth daily as needed for mild constipation or moderate constipation.    . pravastatin (PRAVACHOL) 40 MG tablet Take 1 tablet (40 mg total) by mouth daily at 6 PM. 30 tablet 1  . venlafaxine XR (EFFEXOR-XR) 150 MG 24 hr capsule Take 150 mg by mouth daily with breakfast.     No current facility-administered medications on file prior to visit.    Allergies:   Allergies  Allergen Reactions  . Pseudoephedrine Hcl     Physical Exam General: well developed, well nourished elderly Caucasian male, seated, in no evident distress Head: head normocephalic and atraumatic.  Neck: supple with no carotid or supraclavicular bruits Cardiovascular: regular rate and rhythm, no murmurs Musculoskeletal: no deformity Skin:  no rash/petichiae Vascular:  Normal pulses all  extremities Vitals:   03/11/20 1519  BP: 124/66  Pulse: 60   Neurologic Exam Mental Status: Awake and fully alert. Disoriented to place and time. Recent and remote memory poor. Attention span, concentration and fund of knowledge diminished.  Recall 0/3.  Unable to copy intersecting pentagons or draw clock.  Mood and affect appropriate.  Cranial Nerves: Fundoscopic exam reveals sharp disc margins. Pupils equal, briskly reactive to light. Extraocular movements full without nystagmus. Visual fields full to confrontation. Hearing intact. Facial sensation intact.  Mild left lower facial weakness tongue, palate moves normally and symmetrically.  Motor: Mild left hemiparesis 4/5 with weakness of left grip intrinsic hand muscles.  Orbits right over left upper extremity.  Fine finger movements are diminished on the left.  Weakness of left hip flexors and ankle dorsiflexors.  Tone slightly increased on the left compared to the right.. Sensory.: intact to touch ,pinprick .position and vibratory sensation.  Coordination: Rapid alternating movements normal in all extremities. Finger-to-nose and heel-to-shin performed accurately bilaterally. Gait and Station: Gait not tested as patient apparently is nonambulatory. Reflexes: 1+ and symmetric. Toes downgoing.     Modified Rankin  4   ASSESSMENT: 86 year old Caucasian male with embolic right ACA/MCA and ACA embolic infarcts in April 2021 possibly symptomatic from moderate 70% proximal right ICA stenosis.  He has baseline dementia which appears to have worsened following the strokes.  Vascular risk factors of hypertension, hyperlipidemia, carotid stenosis and age     PLAN: I had a long discussion with the patient and accompanying caregiver from skilled nursing facility about his recent strokes and worsening dementia and mild left hemiparesis.  I recommend he continue aspirin and Plavix for 1 more month and then discontinue Plavix and stay on aspirin alone for  stroke prevention and continue aggressive risk factor modification with strict control of hypertension with blood pressure goal below 130/90, lipids with LDL cholesterol goal below 70 mg percent and diabetes with hemoglobin A1c goal below 6.5%.  I would encourage physical therapy and occupational therapy and mobilization out of bed.  He needs 24-hour care and memory care unit hence is not a candidate for consideration for right carotid revascularization at the present time.  Return for follow-up in the future only as necessary. Greater than 50% of time during this 30 minute visit was spent on counseling,explanation of diagnosis embolic strokes and dementia and carotid stenosis, planning of further management, discussion with patient and family and coordination of care Delia Heady, MD  [facility] [address] [place], [place] [address]  Phone [phone] Fax [phone] Note: This document was prepared with digital dictation and possible smart phrase technology. Any transcriptional errors that result from this process are unintentional

## 2020-03-11 NOTE — Patient Instructions (Signed)
I had a long discussion with the patient and accompanying caregiver from skilled nursing facility about his recent strokes and worsening dementia and mild left hemiparesis.  I recommend he continue aspirin and Plavix for 1 more month and then discontinue Plavix and stay on aspirin alone for stroke prevention and continue aggressive risk factor modification with strict control of hypertension with blood pressure goal below 130/90, lipids with LDL cholesterol goal below 70 mg percent and diabetes with hemoglobin A1c goal below 6.5%.  I would encourage physical therapy and occupational therapy and mobilization out of bed.  He needs 24-hour care and memory care unit hence is not a candidate for consideration for right carotid revascularization at the present time.  Return for follow-up in the future only as necessary.

## 2020-04-22 ENCOUNTER — Other Ambulatory Visit: Payer: Self-pay

## 2020-04-22 NOTE — Patient Outreach (Signed)
[facility]) Care Management  04/22/2020  Florian Chauca [birth year] [phone]   Telephone outreach to patient's nurse Milana Huntsman, LPN at [facility] to obtain mRS was successfully completed. Patient went from rehab to memory care unit. MRS=5  Baruch Gouty [facility] Management Assistant [phone]

## 2020-10-28 DEATH — deceased

## 2021-05-14 IMAGING — CT CT ANGIO NECK
3 of 7 series · 10 of 36 positions shown · IV contrast (omnipaque)
Comparison: CT head 01/07/2020

CLINICAL DATA: Stroke

EXAM:
CT ANGIOGRAPHY HEAD AND NECK
TECHNIQUE: Multidetector CT imaging of the head and neck was performed using
the standard protocol during bolus administration of intravenous
contrast. Multiplanar CT image reconstructions and MIPs were
obtained to evaluate the vascular anatomy. Carotid stenosis
measurements (when applicable) are obtained utilizing NASCET
criteria, using the distal internal carotid diameter as the
denominator.
CONTRAST:  75mL OMNIPAQUE IOHEXOL 350 MG/ML SOLN

[Series 5: cta neck/head · axial · 0.43mm/px · z∈[+989,+1097]mm · 2 of 162 slices shown]
[im 54/162  soft-tissue]
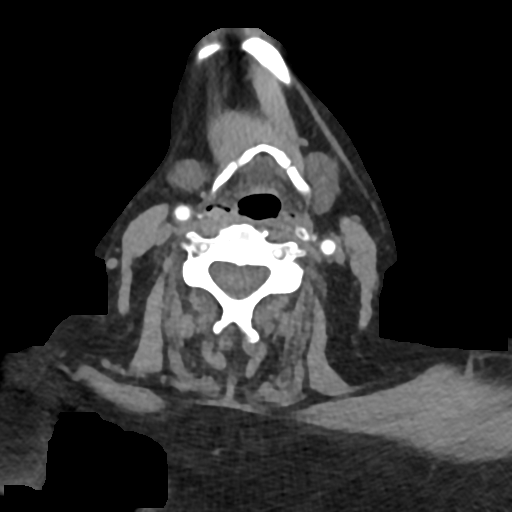
[im 108/162  soft-tissue]
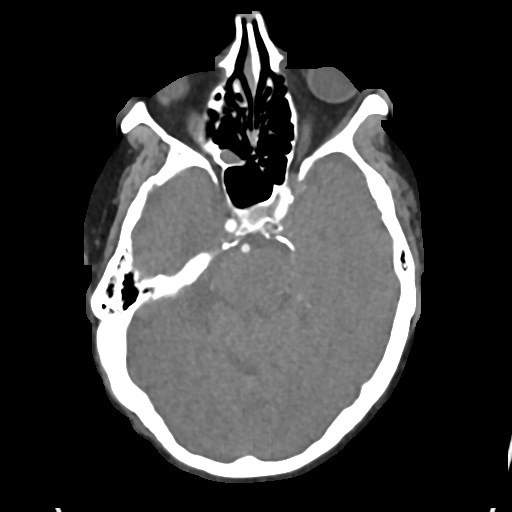

[Series 7: ax thins · axial · 0.39mm/px · z∈[+930,+1161]mm · 6 of 325 slices shown]
[im 47/325  soft-tissue]
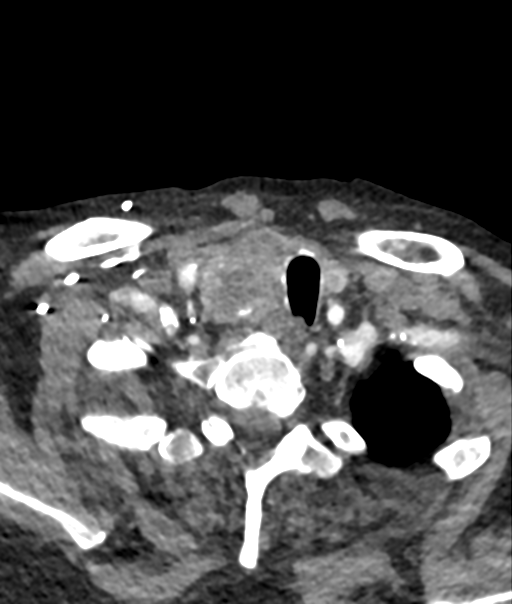
[im 93/325  bone]
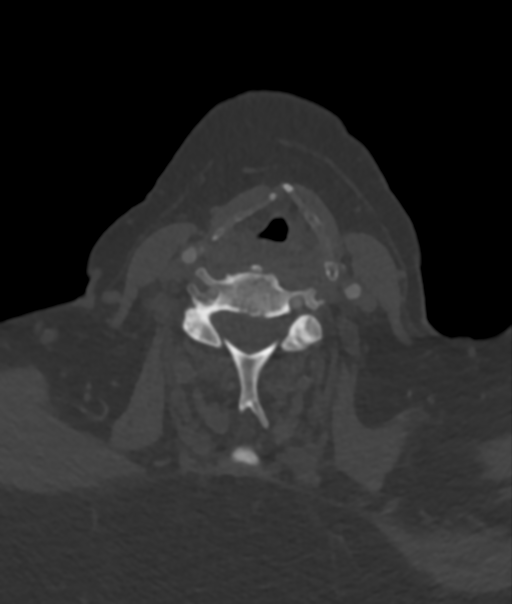
[im 139/325  soft-tissue]
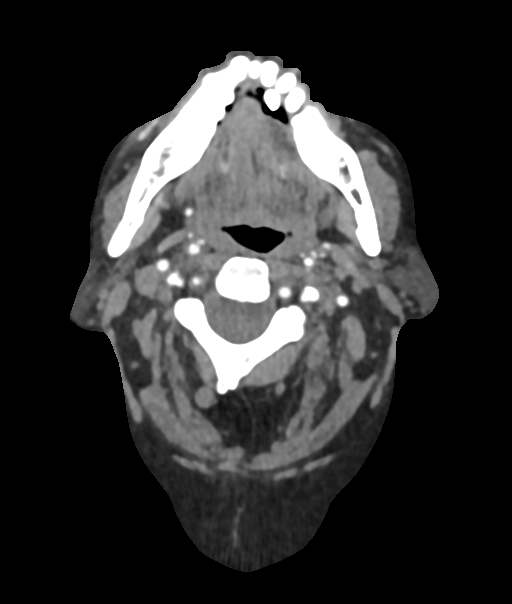
[im 186/325  bone]
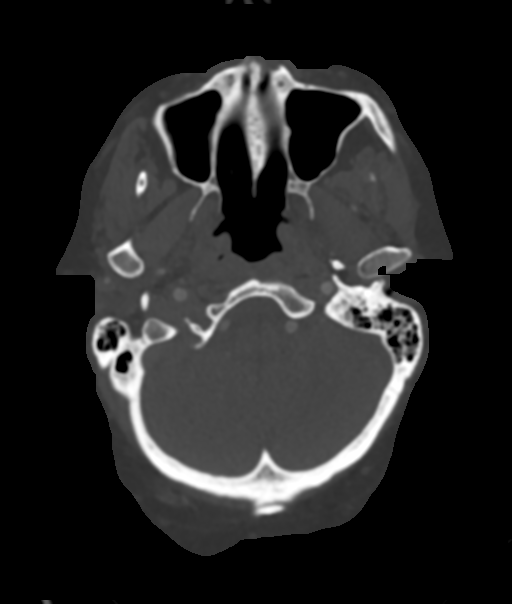
[im 232/325  soft-tissue]
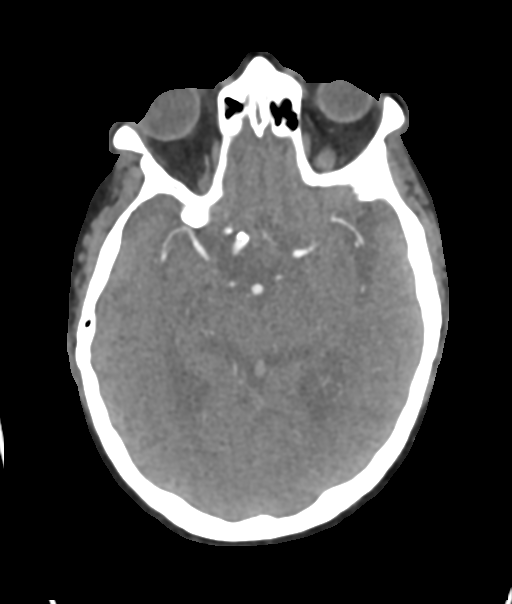
[im 278/325  bone]
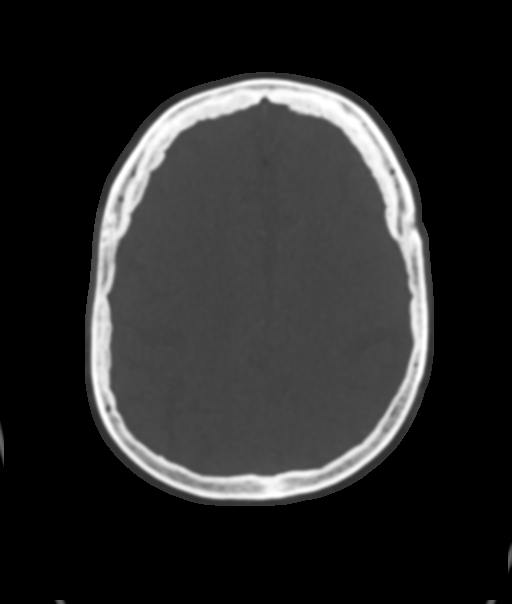

[Series 9: sag thins · sagittal · 0.46mm/px · 2 of 201 slices shown]
[im 3/201  soft-tissue]
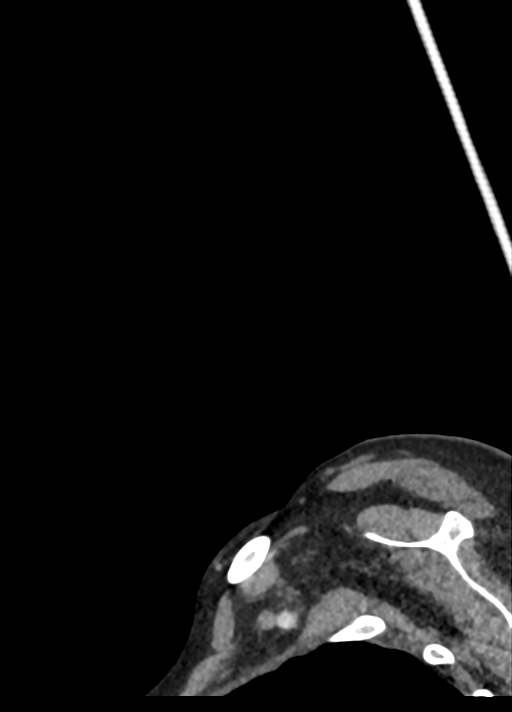
[im 199/201  soft-tissue]
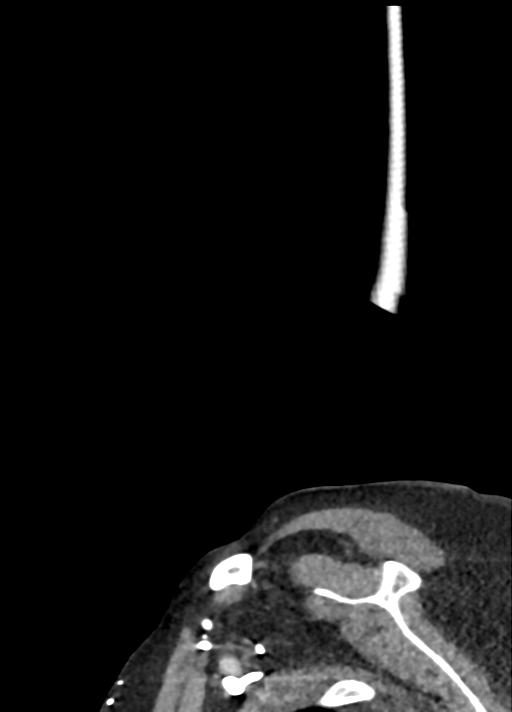

[10 of 36 positions shown; findings below may reference images not displayed]

FINDINGS: Image interpretation was delayed for approximately 19 hours due to
delay in completing the exam by the technologist.

CTA NECK FINDINGS

Aortic arch: Top of the arch was imaged. Proximal great vessels
patent. Atherosclerotic disease in the subclavian arteries
bilaterally left greater than right without significant stenosis.

Right carotid system: Right common carotid artery widely patent.
Atherosclerotic calcification right carotid bulb. Proximal right
internal carotid artery narrowed to 1.26 mm corresponding to
approximately 70% diameter stenosis.

Left carotid system: Atherosclerotic calcification left carotid
bifurcation. Minimal luminal diameter is 1.95 mm corresponding to
50% diameter stenosis.

Vertebral arteries: Both vertebral arteries are patent to the
basilar. Mild stenosis origin of the vertebral artery bilaterally

Skeleton: Moderate degenerative change in the cervical spine. No
acute skeletal abnormality.

Other neck: Right thyroid mass measuring 35 x 25 mm. Small
calcifications in the mass. No adenopathy in the neck.

Upper chest: Negative

Review of the MIP images confirms the above findings

CTA HEAD FINDINGS

Anterior circulation: Atherosclerotic calcification in the cavernous
carotid bilaterally. Moderate stenosis of the right supra
supraclinoid internal carotid artery and mild to moderate stenosis
left cavernous carotid. Anterior and middle cerebral arteries patent
bilaterally with without significant stenosis. Mild atherosclerotic
disease in the MCA branches bilaterally.

Posterior circulation: Both vertebral arteries patent to the
basilar. Mild atherosclerotic calcification distal basilar
bilaterally. Basilar is diffusely diseased without significant
stenosis. PICA patent bilaterally. Right AICA patent. Superior
cerebellar and posterior cerebral arteries patent bilaterally. Mild
atherosclerotic disease in the posterior cerebral arteries
bilaterally.

Venous sinuses: Limited venous enhancement due to timing of the
scan.

Anatomic variants: None

Review of the MIP images confirms the above findings
IMPRESSION: 1. Interpretation this exam was delayed by approximately 19 hours
due to delayed exam completion by the technologist.
2. 70% diameter stenosis proximal right internal carotid artery.
3. 50% diameter stenosis proximal left internal carotid artery
4. Both vertebral arteries patent to the basilar. Mild stenosis in
the proximal and distal vertebral arteries bilaterally.
5. Mild intracranial atherosclerotic disease without large vessel
occlusion.
6. Right thyroid mass. Thyroid ultrasound recommended. (Ref: [HOSPITAL]. [DATE]): 143-50).
7. These results were called by telephone at the time of
interpretation on 01/08/2020 at [DATE] to provider Bhd, who
verbally acknowledged these results.

## 2021-05-15 IMAGING — DX DG CHEST 1V PORT
1 series · 1 of 1 positions shown · non-contrast
Comparison: None.

CLINICAL DATA: Stroke

EXAM:
PORTABLE CHEST 1 VIEW

[chest]
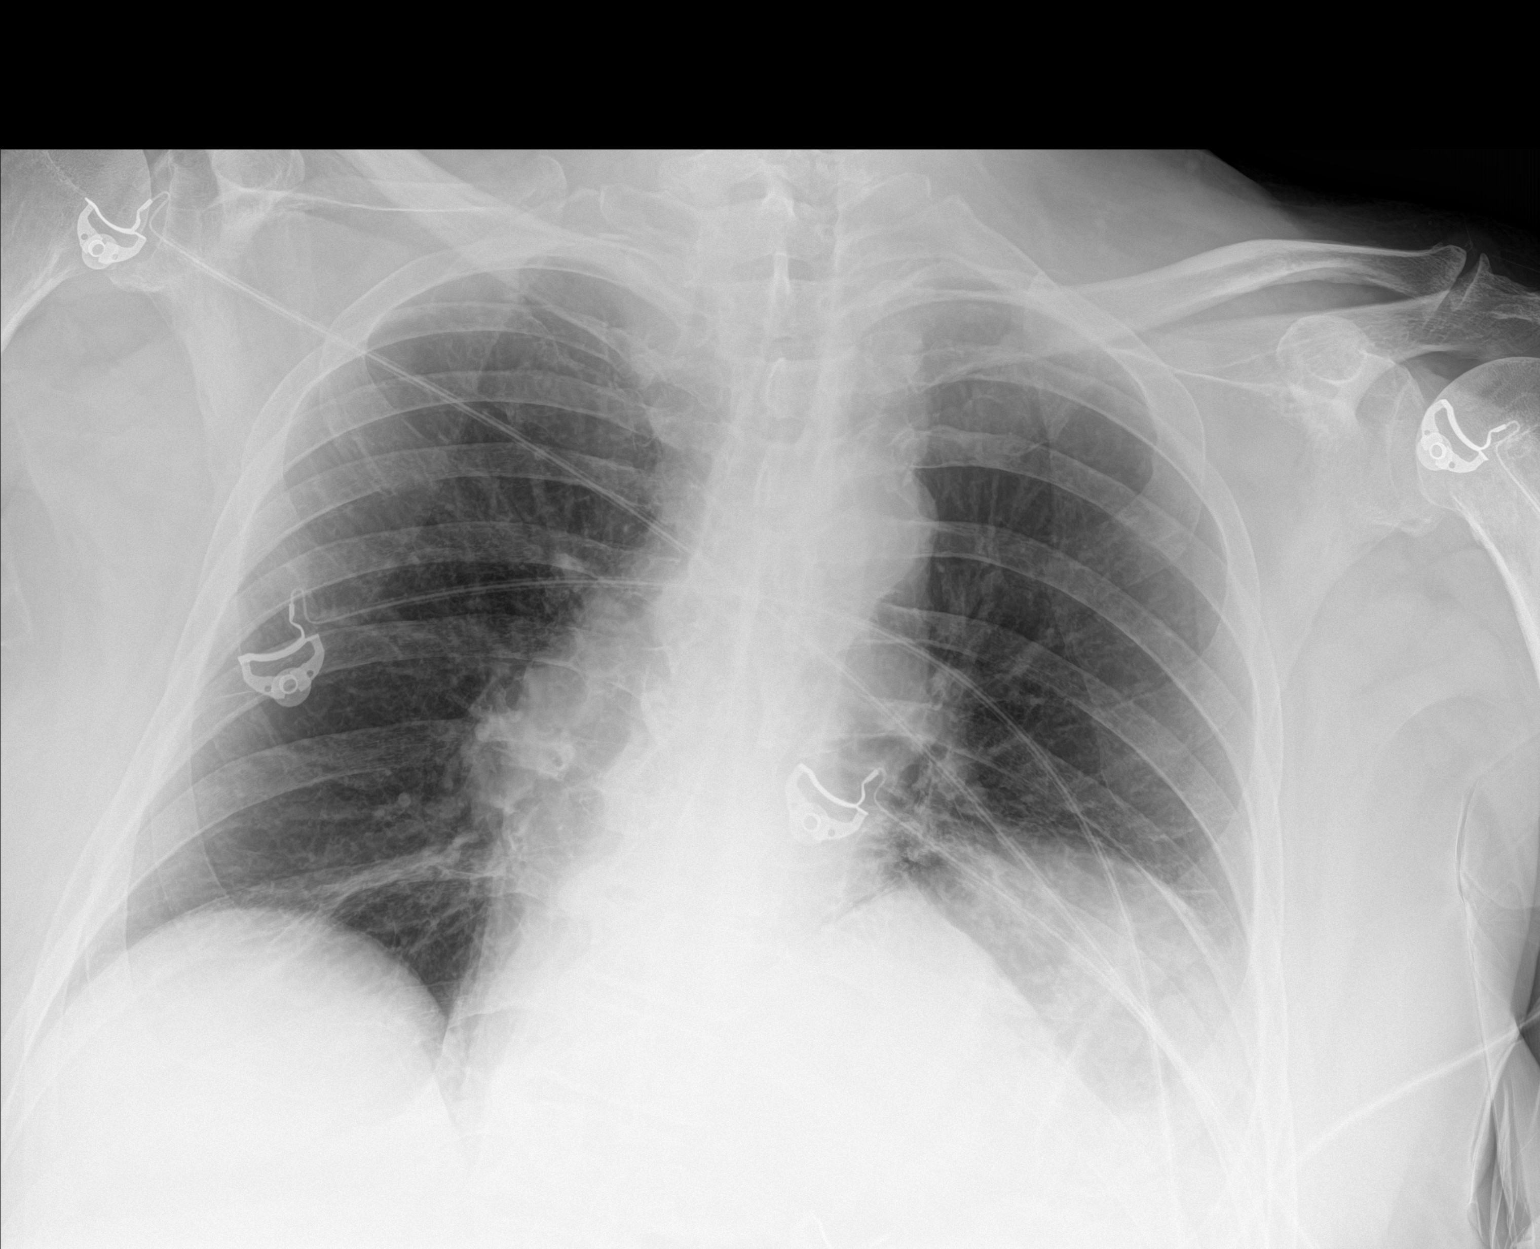

[1 of 1 positions shown; findings below may reference images not displayed]

FINDINGS: Normal cardiomediastinal contours. Area of linear atelectasis near
the right lung base. Limited visualization of the left lower lobe.
Left costophrenic angle excluded from view. No left upper lobe
consolidation.
IMPRESSION: Incompletely visualized left lung base. Otherwise, no acute airspace
disease.
